# Patient Record
Sex: Male | Born: 1990 | Race: Black or African American | Hispanic: No | Marital: Single | Smoking: Current some day smoker
Health system: Southern US, Community
[De-identification: ages and names within clinical notes are randomized; demographics above are authoritative.]

## PROBLEM LIST (undated history)

## (undated) DIAGNOSIS — B20 Human immunodeficiency virus [HIV] disease: Secondary | ICD-10-CM

## (undated) DIAGNOSIS — Z21 Asymptomatic human immunodeficiency virus [HIV] infection status: Secondary | ICD-10-CM

## (undated) HISTORY — DX: Asymptomatic human immunodeficiency virus (hiv) infection status: Z21

## (undated) HISTORY — DX: Human immunodeficiency virus (HIV) disease: B20

---
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
## 2009-04-07 ENCOUNTER — Encounter: Payer: Self-pay | Admitting: Internal Medicine

## 2009-04-07 ENCOUNTER — Ambulatory Visit: Payer: Self-pay | Admitting: Internal Medicine

## 2009-04-07 DIAGNOSIS — J309 Allergic rhinitis, unspecified: Secondary | ICD-10-CM | POA: Insufficient documentation

## 2009-04-07 DIAGNOSIS — L309 Dermatitis, unspecified: Secondary | ICD-10-CM

## 2009-04-07 DIAGNOSIS — B20 Human immunodeficiency virus [HIV] disease: Secondary | ICD-10-CM

## 2009-04-07 DIAGNOSIS — Z21 Asymptomatic human immunodeficiency virus [HIV] infection status: Secondary | ICD-10-CM | POA: Insufficient documentation

## 2009-04-07 LAB — CONVERTED CEMR LAB
ALT: 12 units/L (ref 0–53)
AST: 18 units/L (ref 0–37)
Albumin: 4.4 g/dL (ref 3.5–5.2)
Alkaline Phosphatase: 74 units/L (ref 39–117)
BUN: 9 mg/dL (ref 6–23)
CO2: 26 meq/L (ref 19–32)
Calcium: 9.5 mg/dL (ref 8.4–10.5)
Chloride: 105 meq/L (ref 96–112)
Creatinine, Ser: 1.05 mg/dL (ref 0.40–1.50)
Glucose, Bld: 64 mg/dL — ABNORMAL LOW (ref 70–99)
HIV 1 RNA Quant: 12500 copies/mL — ABNORMAL HIGH (ref <48)
HIV-1 RNA Quant, Log: 4.1 — ABNORMAL HIGH (ref <1.68)
Potassium: 4.2 meq/L (ref 3.5–5.3)
Sodium: 142 meq/L (ref 135–145)
Total Bilirubin: 0.5 mg/dL (ref 0.3–1.2)
Total Protein: 7.6 g/dL (ref 6.0–8.3)

## 2009-08-05 ENCOUNTER — Telehealth: Payer: Self-pay | Admitting: Internal Medicine

## 2009-09-01 ENCOUNTER — Encounter: Payer: Self-pay | Admitting: Internal Medicine

## 2009-09-01 ENCOUNTER — Ambulatory Visit: Payer: Self-pay | Admitting: Internal Medicine

## 2009-09-01 DIAGNOSIS — B3781 Candidal esophagitis: Secondary | ICD-10-CM | POA: Insufficient documentation

## 2009-09-01 LAB — CONVERTED CEMR LAB
ALT: 8 units/L (ref 0–53)
AST: 14 units/L (ref 0–37)
Absolute CD4: 26 #/uL — ABNORMAL LOW (ref 530–1300)
Albumin: 4.4 g/dL (ref 3.5–5.2)
Alkaline Phosphatase: 71 units/L (ref 39–117)
BUN: 8 mg/dL (ref 6–23)
Basophils Absolute: 0 10*3/uL (ref 0.0–0.1)
Basophils Relative: 1 % (ref 0–1)
CD4 T Helper %: 3 % — ABNORMAL LOW (ref 31–52)
CO2: 28 meq/L (ref 19–32)
Calcium: 8.9 mg/dL (ref 8.4–10.5)
Chloride: 103 meq/L (ref 96–112)
Creatinine, Ser: 1.27 mg/dL (ref 0.40–1.50)
Eosinophils Absolute: 0.4 10*3/uL (ref 0.0–0.7)
Eosinophils Relative: 9 % — ABNORMAL HIGH (ref 0–5)
Glucose, Bld: 97 mg/dL (ref 70–99)
HCT: 41.1 % (ref 39.0–52.0)
HIV 1 RNA Quant: 38000 copies/mL — ABNORMAL HIGH (ref <48)
HIV-1 RNA Quant, Log: 4.58 — ABNORMAL HIGH (ref <1.68)
Hemoglobin: 13.3 g/dL (ref 13.0–17.0)
Lymphocytes Relative: 21 % (ref 12–46)
Lymphs Abs: 0.9 10*3/uL (ref 0.7–4.0)
MCHC: 32.4 g/dL (ref 30.0–36.0)
MCV: 89.3 fL (ref 78.0–100.0)
Monocytes Absolute: 0.5 10*3/uL (ref 0.1–1.0)
Monocytes Relative: 12 % (ref 3–12)
Neutro Abs: 2.4 10*3/uL (ref 1.7–7.7)
Neutrophils Relative %: 58 % (ref 43–77)
Platelets: 242 10*3/uL (ref 150–400)
Potassium: 3.8 meq/L (ref 3.5–5.3)
RBC: 4.6 M/uL (ref 4.22–5.81)
RDW: 13.6 % (ref 11.5–15.5)
Sodium: 140 meq/L (ref 135–145)
Total Bilirubin: 1 mg/dL (ref 0.3–1.2)
Total Protein: 7.6 g/dL (ref 6.0–8.3)
Total lymphocyte count: 882 cells/mcL — ABNORMAL LOW (ref 1100–4800)
WBC: 4.2 10*3/uL (ref 4.0–10.5)

## 2009-09-04 ENCOUNTER — Encounter: Payer: Self-pay | Admitting: Internal Medicine

## 2009-09-04 LAB — CONVERTED CEMR LAB

## 2009-12-08 ENCOUNTER — Emergency Department (HOSPITAL_COMMUNITY): Admission: EM | Admit: 2009-12-08 | Discharge: 2009-12-08 | Payer: Self-pay | Admitting: Emergency Medicine

## 2010-01-30 ENCOUNTER — Telehealth: Payer: Self-pay | Admitting: Internal Medicine

## 2010-05-04 ENCOUNTER — Emergency Department (HOSPITAL_COMMUNITY): Admission: EM | Admit: 2010-05-04 | Discharge: 2010-05-04 | Payer: Self-pay | Admitting: Emergency Medicine

## 2010-05-14 ENCOUNTER — Emergency Department (HOSPITAL_COMMUNITY): Admission: EM | Admit: 2010-05-14 | Discharge: 2010-05-14 | Payer: Self-pay | Admitting: Family Medicine

## 2010-05-18 ENCOUNTER — Encounter: Payer: Self-pay | Admitting: Internal Medicine

## 2010-05-27 ENCOUNTER — Ambulatory Visit: Payer: Self-pay | Admitting: Internal Medicine

## 2010-05-27 LAB — CONVERTED CEMR LAB
ALT: <8 U/L
AST: 17 U/L
Albumin: 3.5 g/dL
Alkaline Phosphatase: 39 U/L
BUN: 11 mg/dL
Basophils Absolute: 0 10*3/uL
Basophils Relative: 0 %
CO2: 23 meq/L
Calcium: 8.6 mg/dL
Chloride: 100 meq/L
Creatinine, Ser: 1.44 mg/dL
Eosinophils Absolute: 0 10*3/uL
Eosinophils Relative: 1 %
Glucose, Bld: 96 mg/dL
HCT: 36.3 % — ABNORMAL LOW
HIV 1 RNA Quant: 51700 {copies}/mL — ABNORMAL HIGH
HIV-1 RNA Quant, Log: 4.71 — ABNORMAL HIGH
Hemoglobin: 11.9 g/dL — ABNORMAL LOW
Lymphocytes Relative: 19 %
Lymphs Abs: 0.7 10*3/uL
MCHC: 32.8 g/dL
MCV: 87.7 fL
Monocytes Absolute: 0.3 10*3/uL
Monocytes Relative: 8 %
Neutro Abs: 2.7 10*3/uL
Neutrophils Relative %: 71 %
Platelets: 221 10*3/uL
Potassium: 4 meq/L
RBC: 4.14 M/uL — ABNORMAL LOW
RDW: 12.7 %
Sodium: 133 meq/L — ABNORMAL LOW
Total Bilirubin: 0.5 mg/dL
Total Protein: 7.8 g/dL
WBC: 3.8 10*3/uL — ABNORMAL LOW

## 2010-06-23 ENCOUNTER — Ambulatory Visit: Payer: Self-pay | Admitting: Internal Medicine

## 2010-06-23 DIAGNOSIS — B370 Candidal stomatitis: Secondary | ICD-10-CM | POA: Insufficient documentation

## 2010-06-24 ENCOUNTER — Encounter (INDEPENDENT_AMBULATORY_CARE_PROVIDER_SITE_OTHER): Payer: Self-pay | Admitting: *Deleted

## 2010-07-09 ENCOUNTER — Encounter (INDEPENDENT_AMBULATORY_CARE_PROVIDER_SITE_OTHER): Payer: Self-pay | Admitting: *Deleted

## 2010-07-15 ENCOUNTER — Ambulatory Visit: Payer: Self-pay | Admitting: Internal Medicine

## 2010-07-15 ENCOUNTER — Encounter (INDEPENDENT_AMBULATORY_CARE_PROVIDER_SITE_OTHER): Payer: Self-pay | Admitting: *Deleted

## 2010-08-05 ENCOUNTER — Encounter: Payer: Self-pay | Admitting: Internal Medicine

## 2010-08-05 ENCOUNTER — Encounter (INDEPENDENT_AMBULATORY_CARE_PROVIDER_SITE_OTHER): Payer: Self-pay | Admitting: *Deleted

## 2010-08-31 ENCOUNTER — Ambulatory Visit: Payer: Self-pay | Admitting: Internal Medicine

## 2010-08-31 ENCOUNTER — Encounter (INDEPENDENT_AMBULATORY_CARE_PROVIDER_SITE_OTHER): Payer: Self-pay | Admitting: *Deleted

## 2010-08-31 LAB — CONVERTED CEMR LAB
ALT: 22 units/L (ref 0–53)
AST: 24 units/L (ref 0–37)
Albumin: 3.8 g/dL (ref 3.5–5.2)
Alkaline Phosphatase: 54 units/L (ref 39–117)
BUN: 5 mg/dL — ABNORMAL LOW (ref 6–23)
Basophils Absolute: 0 10*3/uL (ref 0.0–0.1)
Basophils Relative: 1 % (ref 0–1)
CO2: 25 meq/L (ref 19–32)
Calcium: 9.4 mg/dL (ref 8.4–10.5)
Chloride: 103 meq/L (ref 96–112)
Creatinine, Ser: 1.05 mg/dL (ref 0.40–1.50)
Eosinophils Absolute: 0.3 10*3/uL (ref 0.0–0.7)
Eosinophils Relative: 10 % — ABNORMAL HIGH (ref 0–5)
Glucose, Bld: 88 mg/dL (ref 70–99)
HCT: 39.7 % (ref 39.0–52.0)
HIV 1 RNA Quant: 39 copies/mL — ABNORMAL HIGH (ref <20)
HIV-1 RNA Quant, Log: 1.59 — ABNORMAL HIGH (ref <1.30)
Hemoglobin: 12.9 g/dL — ABNORMAL LOW (ref 13.0–17.0)
Lymphocytes Relative: 37 % (ref 12–46)
Lymphs Abs: 1.3 10*3/uL (ref 0.7–4.0)
MCHC: 32.5 g/dL (ref 30.0–36.0)
MCV: 93.6 fL (ref 78.0–100.0)
Monocytes Absolute: 0.5 10*3/uL (ref 0.1–1.0)
Monocytes Relative: 14 % — ABNORMAL HIGH (ref 3–12)
Neutro Abs: 1.4 10*3/uL — ABNORMAL LOW (ref 1.7–7.7)
Neutrophils Relative %: 38 % — ABNORMAL LOW (ref 43–77)
Platelets: 266 10*3/uL (ref 150–400)
Potassium: 3.7 meq/L (ref 3.5–5.3)
RBC: 4.24 M/uL (ref 4.22–5.81)
RDW: 15.9 % — ABNORMAL HIGH (ref 11.5–15.5)
Sodium: 139 meq/L (ref 135–145)
Total Bilirubin: 0.4 mg/dL (ref 0.3–1.2)
Total Protein: 6.9 g/dL (ref 6.0–8.3)
WBC: 3.5 10*3/uL — ABNORMAL LOW (ref 4.0–10.5)

## 2010-09-09 ENCOUNTER — Encounter (INDEPENDENT_AMBULATORY_CARE_PROVIDER_SITE_OTHER): Payer: Self-pay | Admitting: *Deleted

## 2010-09-11 ENCOUNTER — Encounter: Payer: Self-pay | Admitting: Internal Medicine

## 2010-09-14 ENCOUNTER — Telehealth (INDEPENDENT_AMBULATORY_CARE_PROVIDER_SITE_OTHER): Payer: Self-pay | Admitting: *Deleted

## 2010-09-14 ENCOUNTER — Ambulatory Visit: Payer: Self-pay | Admitting: Internal Medicine

## 2010-09-14 DIAGNOSIS — A630 Anogenital (venereal) warts: Secondary | ICD-10-CM | POA: Insufficient documentation

## 2010-09-16 ENCOUNTER — Encounter (INDEPENDENT_AMBULATORY_CARE_PROVIDER_SITE_OTHER): Payer: Self-pay | Admitting: *Deleted

## 2010-09-18 ENCOUNTER — Encounter: Payer: Self-pay | Admitting: Internal Medicine

## 2010-09-21 ENCOUNTER — Encounter: Payer: Self-pay | Admitting: Internal Medicine

## 2010-10-05 ENCOUNTER — Encounter: Payer: Self-pay | Admitting: Infectious Diseases

## 2010-10-27 ENCOUNTER — Encounter: Payer: Self-pay | Admitting: Internal Medicine

## 2010-11-13 ENCOUNTER — Encounter: Payer: Self-pay | Admitting: Internal Medicine

## 2010-12-15 ENCOUNTER — Ambulatory Visit
Admission: RE | Admit: 2010-12-15 | Discharge: 2010-12-15 | Payer: Self-pay | Source: Home / Self Care | Attending: Internal Medicine | Admitting: Internal Medicine

## 2010-12-15 ENCOUNTER — Encounter: Payer: Self-pay | Admitting: Internal Medicine

## 2010-12-15 LAB — CONVERTED CEMR LAB
ALT: 9 units/L (ref 0–53)
AST: 15 units/L (ref 0–37)
Albumin: 4.3 g/dL (ref 3.5–5.2)
Alkaline Phosphatase: 71 units/L (ref 39–117)
BUN: 10 mg/dL (ref 6–23)
Basophils Absolute: 0 10*3/uL (ref 0.0–0.1)
Basophils Relative: 1 % (ref 0–1)
CO2: 27 meq/L (ref 19–32)
Calcium: 9.6 mg/dL (ref 8.4–10.5)
Chloride: 105 meq/L (ref 96–112)
Creatinine, Ser: 1.16 mg/dL (ref 0.40–1.50)
Eosinophils Absolute: 0.7 10*3/uL (ref 0.0–0.7)
Eosinophils Relative: 13 % — ABNORMAL HIGH (ref 0–5)
Glucose, Bld: 81 mg/dL (ref 70–99)
HCT: 40.3 % (ref 39.0–52.0)
HIV 1 RNA Quant: <20 copies/mL (ref <20)
HIV-1 RNA Quant, Log: <1.3 (ref <1.30)
Hemoglobin: 13.6 g/dL (ref 13.0–17.0)
Lymphocytes Relative: 29 % (ref 12–46)
Lymphs Abs: 1.4 10*3/uL (ref 0.7–4.0)
MCHC: 33.7 g/dL (ref 30.0–36.0)
MCV: 93.3 fL (ref 78.0–100.0)
Monocytes Absolute: 0.4 10*3/uL (ref 0.1–1.0)
Monocytes Relative: 9 % (ref 3–12)
Neutro Abs: 2.4 10*3/uL (ref 1.7–7.7)
Neutrophils Relative %: 48 % (ref 43–77)
Platelets: 250 10*3/uL (ref 150–400)
Potassium: 4.6 meq/L (ref 3.5–5.3)
RBC: 4.32 M/uL (ref 4.22–5.81)
RDW: 13 % (ref 11.5–15.5)
Sodium: 139 meq/L (ref 135–145)
Total Bilirubin: 2.8 mg/dL — ABNORMAL HIGH (ref 0.3–1.2)
Total Protein: 7.2 g/dL (ref 6.0–8.3)
WBC: 4.9 10*3/uL (ref 4.0–10.5)

## 2010-12-28 ENCOUNTER — Ambulatory Visit: Admit: 2010-12-28 | Payer: Self-pay | Admitting: Internal Medicine

## 2011-01-12 ENCOUNTER — Ambulatory Visit: Admit: 2011-01-12 | Payer: Self-pay | Admitting: Adult Health

## 2011-01-12 NOTE — Miscellaneous (Signed)
Summary: [place]  SS Administration  [place]  SS Administration   Imported By: Florinda Marker 10/27/2010 16:15:58  _____________________________________________________________________  External Attachment:    Type:   Image     Comment:   External Document

## 2011-01-12 NOTE — Miscellaneous (Signed)
Summary: Bridge Counselor  Clinical Lists Changes BC enrolled pt into the bridge counseling program this date. BC provided HIV education and safe sex practices. BC reviewed the control measures with pt and gave pt condoms. BC also assisted, with the help of [facility], pt with accessing his medications. Pt has all medications that were prescribed. BC gave pt a pillbox and provided Tx adherence while watching pt fill up his pillbox. BC discussed wth pt each medication, how and when to take them and what each was for. BC will assist pt with applying for medicaid and disability.  Sharol Roussel  June 25, 2010 11:12 AM

## 2011-01-12 NOTE — Consult Note (Signed)
Summary: [facility]: Dr. Alferd Patee [facility]: Dr. Pershing Proud   Imported By: Florinda Marker 09/03/2010 16:33:31  _____________________________________________________________________  External Attachment:    Type:   Image     Comment:   External Document

## 2011-01-12 NOTE — Progress Notes (Signed)
Summary: [facility]  Phone Note Call from Patient   Caller: Patient Summary of Call: RX for Fluconazole and Aldara cream called to [facility]  Initial call taken by: Wendall Mola CMA Duncan Dull),  September 14, 2010 12:22 PM

## 2011-01-12 NOTE — Miscellaneous (Signed)
Summary: HIPAA Restrictions  HIPAA Restrictions   Imported By: Florinda Marker 05/29/2010 09:58:54  _____________________________________________________________________  External Attachment:    Type:   Image     Comment:   External Document

## 2011-01-12 NOTE — Consult Note (Signed)
Summary: [facility] Healthcare:Dermatology  [facility] Healthcare:Dermatology   Imported By: Florinda Marker 09/17/2010 10:46:07  _____________________________________________________________________  External Attachment:    Type:   Image     Comment:   External Document

## 2011-01-12 NOTE — Miscellaneous (Signed)
Summary: Bridge Counselor  Clinical Lists Changes BC closed pt's file for bridge counseling today. Pt is enrolled at [facility] for medical case management.  Sharol Roussel  September 16, 2010 12:05 PM

## 2011-01-12 NOTE — Miscellaneous (Signed)
Summary: Bridge Counselor  Clinical Lists Changes BC transported pt to [facility] today for lab work. Pt also brought his new medicaid card with him as he has been approved. Pt will now access his medications through [facility]. BC made ct's referral to [facility] and let Paulo Fruit know because pt was receiving his medications through ADAP. BC will assist pt with the transition prior to referring him to [facility] for ongoing case management.  Sharol Roussel  August 31, 2010 2:03 PM

## 2011-01-12 NOTE — Miscellaneous (Signed)
Summary: RW Update  Clinical Lists Changes  Observations: Added new observation of PAYOR: Medicaid (09/18/2010 11:06)

## 2011-01-12 NOTE — Miscellaneous (Signed)
Summary: Bridge Counselor  Clinical Lists Changes BC transported pt to DSS today to apply for medicaid. Pt was told that since he is under 21 and had no paperwork to turn in that his application would be processed pretty quickly. BC also called [facility] to schedule his dermotology appointment. Pt will be seen on August 24th at 10am unless his medicaid is approved before then.   Sharol Roussel  July 09, 2010 5:24 PM

## 2011-01-12 NOTE — Assessment & Plan Note (Signed)
Summary: START MEDS/VS   CC:  pt. here to be started on HIV meds and refill Azithromycin.  History of Present Illness: Pt was aapproved for ADAP and is ready to start HIV meds. He has gained 10lbs and feels better. No new problems.  Preventive Screening-Counseling & Management  Alcohol-Tobacco     Alcohol drinks/day: 0     Smoking Status: current     Packs/Day: <0.25  Caffeine-Diet-Exercise     Caffeine use/day: tea and sodas     Does Patient Exercise: yes     Type of exercise: walking     Exercise (avg: min/session): <30     Times/week: 3  Safety-Violence-Falls     Seat Belt Use: yes      Sexual History:  dating.    Comments: pt. given condoms   Updated Prior Medication List: BACTRIM DS 800-160 MG TABS (SULFAMETHOXAZOLE-TRIMETHOPRIM) Take 1 tablet by mouth once a day CLARITIN 10 MG TABS (LORATADINE) Take 1 tablet by mouth once a day TRIAMCINOLONE ACETONIDE 0.1 % CREA (TRIAMCINOLONE ACETONIDE) apply two times a day AZITHROMYCIN 600 MG TABS (AZITHROMYCIN) take 2 tablets once a week TRUVADA 200-300 MG TABS (EMTRICITABINE-TENOFOVIR) Take 1 tablet by mouth once a day REYATAZ 300 MG CAPS (ATAZANAVIR SULFATE) Take 1 capsule by mouth once a day NORVIR 100 MG TABS (RITONAVIR) Take 1 tablet by mouth once a day  Current Allergies (reviewed today): No known allergies  Past History:  Past Medical History: Last updated: 04/07/2009 HIV disease Allergic rhinitis  Social History: Sexual History:  dating  Review of Systems  The patient denies anorexia, fever, headaches, and abdominal pain.    Vital Signs:  Patient profile:   20 year old male Height:      67 inches (170.18 cm) Weight:      148.8 pounds (67.64 kg) BMI:     23.39 Temp:     98.1 degrees F (36.72 degrees C) oral Pulse rate:   80 / minute BP sitting:   117 / 75  (right arm)  Vitals Entered By: Wendall Mola CMA Duncan Dull) (July 15, 2010 3:19 PM) CC: pt. here to be started on HIV meds, refill  Azithromycin Is Patient Diabetic? No Pain Assessment Patient in pain? no      Nutritional Status BMI of 19 -24 = normal Nutritional Status Detail appetite "great"  Have you ever been in a relationship where you felt threatened, hurt or afraid?No   Does patient need assistance? Functional Status Self care Ambulation Normal Comments pt. missed three doses of meds since last visit   Physical Exam  General:  alert, well-developed, well-nourished, and well-hydrated.   Head:  normocephalic and atraumatic.   Mouth:  no thrush  Lungs:  normal breath sounds.      Impression & Recommendations:  Problem # 1:  HIV DISEASE (ICD-042) Genotype shows a 103N mutation. Discussed treatment options.  Pt would like a once a day regimen.  Will start Reyataz, Norvir and Truvada.  Potential side effects discussed.  Emphasized the need to take his meds every day. He willl return in 6 weeks for repeat labs. 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 Take 1  tablet by mouth once a day 2)  Reyataz 300 Mg Caps (Atazanavir sulfate) .... Take 1 capsule by mouth once a day 3)  Norvir 100 Mg Tabs (Ritonavir) .... Take 1 tablet by mouth once a day  Other Orders: Est. Patient Level IV (16109) Future Orders: T-CD4SP ([facility]) (CD4SP) ... 08/26/2010 T-HIV Viral Load [id number]) ... 08/26/2010 T-Comprehensive Metabolic Panel [id number]) ... 08/26/2010 T-CBC w/Diff ([id number]) ... 08/26/2010  Patient Instructions: 1)  Please schedule a follow-up appointment in 8 weeks, 2 weeks after labs.

## 2011-01-12 NOTE — Miscellaneous (Signed)
Summary: clinical update/ryan white  Clinical Lists Changes  Observations: Added new observation of RWTITLE: B (07/15/2010 9:04) Added new observation of PAYOR: No Insurance (07/15/2010 9:04) Added new observation of AIDSDAP: Yes 2011 (07/15/2010 9:04) Added new observation of INCOMESOURCE: none (07/15/2010 9:04) Added new observation of HOUSEINCOME: 0  (07/15/2010 9:04) Added new observation of #CHILD<18 IN: No  (07/15/2010 9:04) Added new observation of FAMILYSIZE: 1  (07/15/2010 9:04) Added new observation of HOUSING: Stable/permanent  (07/15/2010 9:04) Added new observation of FINASSESSDT: 06/24/2010  (07/15/2010 9:04) Added new observation of YEARLYEXPEN: 0  (07/15/2010 9:04) Added new observation of GENDER: Male  (07/15/2010 9:04) Added new observation of MARITAL STAT: Single  (07/15/2010 9:04) Added new observation of LATINO/HISP: No  (07/15/2010 9:04) Added new observation of RACE: African American  (07/15/2010 9:04) Added new observation of REC_MESSAGE: Yes  (07/15/2010 9:04) Added new observation of RECPHONECALL: Yes  (07/15/2010 9:04) Added new observation of REC_MAIL: Yes  (07/15/2010 9:04) Added new observation of RW VITAL STA: Active  (07/15/2010 9:04) Added new observation of PATNTCOUNTY: [place]  (07/15/2010 9:04) Added new observation of RWPARTICIP: Yes  (07/15/2010 9:04)

## 2011-01-12 NOTE — Miscellaneous (Signed)
Summary: Bridge Counselor  Clinical Lists Changes BC transported pt to [facility] today for his follow up appointment with dermatology. Pt also reports that [facility] has been out and has completed his enrollment. Pt will see Dr. Philipp Deputy on October 3rd and then has an intake appointment with [facility] for medical case management. Pt has been compliant with all medications and is doing very well. BC will close pt's file.  Sharol Roussel  September 09, 2010 2:30 PM

## 2011-01-12 NOTE — Miscellaneous (Signed)
Summary: [facility]  [facility]   Imported By: Florinda Marker 10/05/2010 11:37:39  _____________________________________________________________________  External Attachment:    Type:   Image     Comment:   External Document

## 2011-01-12 NOTE — Assessment & Plan Note (Signed)
Summary: F/U [MKJ]   CC:  follow-up visit, lab results, and c/o anal warts requesting Aldara Cream.  History of Present Illness: Pt doing well on his meds.  No side effects.  Feels better. He has some anal warts and has been referred toa specilaist. He requests some aldara.  Preventive Screening-Counseling & Management  Alcohol-Tobacco     Alcohol drinks/day: 0     Smoking Status: current     Packs/Day: <0.25  Caffeine-Diet-Exercise     Caffeine use/day: tea and sodas     Does Patient Exercise: yes     Type of exercise: walking     Exercise (avg: min/session): <30     Times/week: 3  Safety-Violence-Falls     Seat Belt Use: yes      Sexual History:  dating.        Drug Use:  current and occasional maijuana.    Comments: pt. given condoms   Updated Prior Medication List: BACTRIM DS 800-160 MG TABS (SULFAMETHOXAZOLE-TRIMETHOPRIM) Take 1 tablet by mouth once a day CLARITIN 10 MG TABS (LORATADINE) Take 1 tablet by mouth once a day TRIAMCINOLONE ACETONIDE 0.1 % CREA (TRIAMCINOLONE ACETONIDE) apply two times a day AZITHROMYCIN 600 MG TABS (AZITHROMYCIN) take 2 tablets once a week TRUVADA 200-300 MG TABS (EMTRICITABINE-TENOFOVIR) Take 1 tablet by mouth once a day REYATAZ 300 MG CAPS (ATAZANAVIR SULFATE) Take 1 capsule by mouth once a day NORVIR 100 MG TABS (RITONAVIR) Take 1 tablet by mouth once a day FLUCONAZOLE 100 MG TABS (FLUCONAZOLE) Take 1 tablet by mouth once a day ALDARA 5 % CREA (IMIQUIMOD) apply 3 times a week and leave on for 8 hours and then wash off  Current Allergies (reviewed today): No known allergies  Past History:  Past Medical History: Last updated: 04/07/2009 HIV disease Allergic rhinitis  Social History: Drug Use:  current, occasional maijuana  Review of Systems  The patient denies anorexia, fever, and weight loss.    Vital Signs:  Patient profile:   20 year old male Height:      67 inches (170.18 cm) Weight:      170.8 pounds (77.64  kg) BMI:     26.85 Temp:     98.2 degrees F (36.78 degrees C) oral Pulse rate:   60 / minute BP sitting:   120 / 66  (left arm)  Vitals Entered By: Wendall Mola CMA Duncan Dull) (September 14, 2010 10:31 AM) CC: follow-up visit, lab results, c/o anal warts requesting Aldara Cream Is Patient Diabetic? No Pain Assessment Patient in pain? no      Nutritional Status BMI of 25 - 29 = overweight Nutritional Status Detail appetite "great"  Does patient need assistance? Functional Status Self care Ambulation Normal Comments no missed doses of meds per pt.   Physical Exam  General:  alert, well-developed, well-nourished, and well-hydrated.   Head:  normocephalic and atraumatic.   Mouth:  pharynx pink and moist.  some thrush present Lungs:  normal breath sounds.      Impression & Recommendations:  Problem # 1:  HIV DISEASE (ICD-042) Pt.s most recent CD4ct was 30 and VL 39 .  Pt instructed to continue the current antiretroviral regimen.  Pt encouraged to take medication regularly and not miss doses.  Pt will f/u in 3 months for repeat blood work and will see me 2 weeks later.  Diagnostics Reviewed:  HIV: CDC-defined AIDS (09/01/2009)   CD4: 30 (09/01/2010)   WBC: 3.5 (08/31/2010)   Hgb: 12.9 (08/31/2010)  HCT: 39.7 (08/31/2010)   Platelets: 266 (08/31/2010) HIV genotype: See Comment (09/04/2009)   HIV-1 RNA: 39 (08/31/2010)   HBSAg: NEG (06/27/2008)  Problem # 2:  THRUSH (ICD-112.0) treat with fluconazole  Problem # 3:  CONDYLOMA ACUMINATUM (ICD-078.11) aldara cream  Medications Added to Medication List This Visit: 1)  Fluconazole 100 Mg Tabs (Fluconazole) .... Take 1 tablet by mouth once a day 2)  Aldara 5 % Crea (Imiquimod) .... Apply 3 times a week and leave on for 8 hours and then wash off  Other Orders: Influenza Vaccine NON MCR (40347) Est. Patient Level III (42595) Future Orders: T-CD4SP ([facility]) (CD4SP) ... 12/13/2010 T-HIV Viral Load [id number]) ...  12/13/2010 T-Comprehensive Metabolic Panel [id number]) ... 12/13/2010 T-CBC w/Diff ([id number]) ... 12/13/2010 T-RPR (Syphilis) [phone]) ... 12/13/2010  Patient Instructions: 1)  Please schedule a follow-up appointment in 3 months, 2 weeks after labs.  Prescriptions: ALDARA 5 % CREA (IMIQUIMOD) apply 3 times a week and leave on for 8 hours and then wash off  #36 packets x 0   Entered and Authorized by:   Yisroel Ramming MD   Signed by:   Yisroel Ramming MD on 09/14/2010   Method used:   Print then Give to Patient   RxID:   [id number] FLUCONAZOLE 100 MG TABS (FLUCONAZOLE) Take 1 tablet by mouth once a day  #10 x 0   Entered and Authorized by:   Yisroel Ramming MD   Signed by:   Yisroel Ramming MD on 09/14/2010   Method used:   Print then Give to Patient   RxID:   [id number]    Immunizations Administered:  Influenza Vaccine # 1:    Vaccine Type: Fluvax Non-MCR    Site: left deltoid    Mfr: Novartis    Dose: 0.5 ml    Route: IM    Given by: Wendall Mola CMA ( AAMA)    Exp. Date: 03/14/2011    Lot #: [id number]    VIS given: 07/07/10 version given September 14, 2010.  Flu Vaccine Consent Questions:    Do you have a history of severe allergic reactions to this vaccine? no    Any prior history of allergic reactions to egg and/or gelatin? no    Do you have a sensitivity to the preservative Thimersol? no    Do you have a past history of Guillan-Barre Syndrome? no    Do you currently have an acute febrile illness? no    Have you ever had a severe reaction to latex? no    Vaccine information given and explained to patient? yes

## 2011-01-12 NOTE — Miscellaneous (Signed)
Summary: [facility]   Imported By: Florinda Marker 11/13/2010 14:49:15  _____________________________________________________________________  External Attachment:    Type:   Image     Comment:   External Document

## 2011-01-12 NOTE — Progress Notes (Signed)
Summary: Fluconazole refill  Phone Note Refill Request Message from:  Pharmacy on January 30, 2010 9:33 AM  Fluconazole 100 mg refill requested from [facility] [phone]. Pt. last seen 09/01/09. TC to pt. #[phone], no asnwer and no VM. 02/02/10 ML with pharmacy pt. needs an MD appt. Sharen Heck RN         Method Requested: Telephone to Pharmacy Next Appointment Scheduled: none Initial call taken by: Sharen Heck RN,  January 30, 2010 9:36 AM  Follow-up for Phone Call        needs appt Follow-up by: Yisroel Ramming MD,  January 30, 2010 10:51 AM

## 2011-01-12 NOTE — Miscellaneous (Signed)
Summary: Lab orders  Clinical Lists Changes  Orders: Added new Test order of T-CBC w/Diff [phone]) - Signed Added new Test order of T-CD4SP [facility]) (CD4SP) - Signed Added new Test order of T-HIV Viral Load [phone]) - Signed Added new Test order of T-Comprehensive Metabolic Panel ([id number]) - Signed

## 2011-01-12 NOTE — Miscellaneous (Signed)
Summary: Bridge Counselor  Clinical Lists Changes BC transported pt to [facility] today for his dermatology appointment. Pt has severely dry skin and ezcema. Pt was given three prescriptions for the scars and itching. Pt was not able to get these filled today due to the cost. Pt should receive his medicaid within the next two weeks and BC will coordinate obtaining his medications once this happens. Pt has been taking his HIV medications as directed. BC provided more Tx adherence today as well as condoms. Pt states that he wants to live a long life and plans to remain compliant.  [facility]  August 05, 2010 2:00 PM

## 2011-01-12 NOTE — Miscellaneous (Signed)
  Clinical Lists Changes  Orders: Added new Referral order of Dermatology Referral (Derma) - Signed 

## 2011-01-12 NOTE — Miscellaneous (Signed)
Summary: Bridge Counselor  Clinical Lists Changes BC transported pt to [facility] today for his scheduled appointment with Dr. Philipp Deputy. Pt was given medications today and they will be shipped to his home next Tuesday. BC discussed Tx adherence with pt today as well. Pt also has applied for his disability.  Sharol Roussel  July 15, 2010 5:01 PM

## 2011-01-12 NOTE — Assessment & Plan Note (Signed)
Summary: new 042/rescheduled from 6/29/kam   CC:  New patient.  History of Present Illness: Pt here to get re-established.  He has not been in care for his HIV for several years due to transportation issues, moving frequently and lack of support. None of his immediate family members know that he is HIV positive.  he is currently staying with his grandmother here in [place].  He c/o night sweats, thrush and pruritic rash mostly on stomach and back.  Preventive Screening-Counseling & Management  Alcohol-Tobacco     Alcohol drinks/day: 0     Smoking Status: current     Packs/Day: <0.25  Caffeine-Diet-Exercise     Caffeine use/day: tea and sodas     Does Patient Exercise: yes     Type of exercise: walks, gym     Exercise (avg: min/session): <30     Times/week: 3  Safety-Violence-Falls     Seat Belt Use: yes   Updated Prior Medication List: BACTRIM DS 800-160 MG TABS (SULFAMETHOXAZOLE-TRIMETHOPRIM) Take 1 tablet by mouth once a day CLARITIN 10 MG TABS (LORATADINE) Take 1 tablet by mouth once a day TRIAMCINOLONE ACETONIDE 0.1 % CREA (TRIAMCINOLONE ACETONIDE) apply two times a day AZITHROMYCIN 600 MG TABS (AZITHROMYCIN) take 2 tablets once a week FLUCONAZOLE 100 MG TABS (FLUCONAZOLE) Take 1 tablet by mouth once a day  Current Allergies (reviewed today): No known allergies  Past History:  Past Medical History: Last updated: 04/07/2009 HIV disease Allergic rhinitis  Review of Systems  The patient denies anorexia, weight loss, prolonged cough, and headaches.    Vital Signs:  Patient profile:   20 year old male Height:      67 inches (170.18 cm) Weight:      139.0 pounds (63.18 kg) BMI:     21.85 Temp:     97.7 degrees F (36.50 degrees C) oral Pulse rate:   103 / minute BP sitting:   106 / 73  (left arm)  Vitals Entered By: Baxter Hire) (June 23, 2010 1:55 PM) CC: New patient Pain Assessment Patient in pain? yes     Location: abdomen Intensity: 5 Type:  burning Onset of pain  pain comes and goes Nutritional Status Detail appetite is so-so per patient  Have you ever been in a relationship where you felt threatened, hurt or afraid?No   Does patient need assistance? Functional Status Self care Ambulation Normal Comments patient has not taken any medications.   Physical Exam  General:  alert, well-developed, well-nourished, and well-hydrated.   Head:  normocephalic and atraumatic.   Mouth:  pharynx pink and moist.  thrush present Lungs:  normal breath sounds.   Heart:  normal rate and regular rhythm.   Skin:  excoriated hyperpigmented patchy rash mostly on stomach and back    Impression & Recommendations:  Problem # 1:  HIV DISEASE (ICD-042) Will refer to bridge counseling to help him obtain his prophylactic medications and apply for Medicaid/ADAP so we can get him started on HIV meds. Will start Bactrim and azithromycin. Pt's genotype shows resistance to Sustiva so Atripla will not be an option.  Once he has a means to obtain his medications I will have him return to discuss treatment options. Disccused safe sex. Orders: New Patient Level III [phone]) Dermatology Referral (Derma) Misc. Referral (Misc. Ref)  Diagnostics Reviewed:  HIV: CDC-defined AIDS (09/01/2009)   CD4: 10 (05/28/2010)   WBC: 3.8 (05/27/2010)   Hgb: 11.9 (05/27/2010)   HCT: 36.3 (05/27/2010)   Platelets: 221 (05/27/2010) HIV  genotype: See Comment (09/04/2009)   HIV-1 RNA: 51700 (05/27/2010)   HBSAg: NEG (06/27/2008)  Problem # 2:  ECZEMA (ICD-692.9) refer to Dermatology The following medications were removed from the medication list:    Betamethasone Dipropionate Aug 0.05 % Crea (Aug betamethasone dipropionate) .Marland Kitchen... Apply two times a day His updated medication list for this problem includes:    Claritin 10 Mg Tabs (Loratadine) .Marland Kitchen... Take 1 tablet by mouth once a day    Triamcinolone Acetonide 0.1 % Crea (Triamcinolone acetonide) .Marland Kitchen... Apply two times a  day  Orders: Dermatology Referral (Derma)  Problem # 3:  THRUSH (ICD-112.0) treat with fluconazole  Medications Added to Medication List This Visit: 1)  Triamcinolone Acetonide 0.1 % Crea (Triamcinolone acetonide) .... Apply two times a day 2)  Azithromycin 600 Mg Tabs (Azithromycin) .... Take 2 tablets once a week 3)  Fluconazole 100 Mg Tabs (Fluconazole) .... Take 1 tablet by mouth once a day Prescriptions: FLUCONAZOLE 100 MG TABS (FLUCONAZOLE) Take 1 tablet by mouth once a day  #10 x 0   Entered and Authorized by:   Yisroel Ramming MD   Signed by:   Yisroel Ramming MD on 06/23/2010   Method used:   Print then Give to Patient   RxID:   [id number] AZITHROMYCIN 600 MG TABS (AZITHROMYCIN) take 2 tablets once a week  #10 x 5   Entered and Authorized by:   Yisroel Ramming MD   Signed by:   Yisroel Ramming MD on 06/23/2010   Method used:   Print then Give to Patient   RxID:   [id number] TRIAMCINOLONE ACETONIDE 0.1 % CREA (TRIAMCINOLONE ACETONIDE) apply two times a day  #60gm x 1   Entered and Authorized by:   Yisroel Ramming MD   Signed by:   Yisroel Ramming MD on 06/23/2010   Method used:   Print then Give to Patient   RxID:   [id number] CLARITIN 10 MG TABS (LORATADINE) Take 1 tablet by mouth once a day  #30 x 5   Entered and Authorized by:   Yisroel Ramming MD   Signed by:   Yisroel Ramming MD on 06/23/2010   Method used:   Print then Give to Patient   RxID:   [id number] BACTRIM DS 800-160 MG TABS (SULFAMETHOXAZOLE-TRIMETHOPRIM) Take 1 tablet by mouth once a day  #30 x 5   Entered and Authorized by:   Yisroel Ramming MD   Signed by:   Yisroel Ramming MD on 06/23/2010   Method used:   Print then Give to Patient   RxID:   [id number]  Sample Given, Lot #: zithromax 600mg  #10 [id number] Expiration Date:06 2012 Patient has been instructed regarding the correct time, dose and frequency of taking this med, including desired effects and most common side effects. Paulo Fruit   BS,CPht II,MPH  June 23, 2010 3:22 PM

## 2011-01-14 NOTE — Miscellaneous (Signed)
Summary: [place] [employer]  [employer] [employer]   Imported By: Florinda Marker 11/23/2010 10:01:07  _____________________________________________________________________  External Attachment:    Type:   Image     Comment:   External Document

## 2011-01-15 ENCOUNTER — Encounter (INDEPENDENT_AMBULATORY_CARE_PROVIDER_SITE_OTHER): Payer: Self-pay | Admitting: *Deleted

## 2011-01-20 NOTE — Miscellaneous (Signed)
Summary: RW update  Clinical Lists Changes  Observations: Added new observation of HIV RISK BEH: MSM (01/15/2011 16:29)

## 2011-01-28 ENCOUNTER — Ambulatory Visit: Payer: Self-pay | Admitting: Adult Health

## 2011-01-29 ENCOUNTER — Ambulatory Visit (INDEPENDENT_AMBULATORY_CARE_PROVIDER_SITE_OTHER): Payer: Medicaid Other | Admitting: Adult Health

## 2011-01-29 ENCOUNTER — Encounter: Payer: Self-pay | Admitting: Adult Health

## 2011-01-29 DIAGNOSIS — L259 Unspecified contact dermatitis, unspecified cause: Secondary | ICD-10-CM

## 2011-01-29 DIAGNOSIS — B20 Human immunodeficiency virus [HIV] disease: Secondary | ICD-10-CM

## 2011-01-29 DIAGNOSIS — A630 Anogenital (venereal) warts: Secondary | ICD-10-CM

## 2011-02-01 ENCOUNTER — Encounter (INDEPENDENT_AMBULATORY_CARE_PROVIDER_SITE_OTHER): Payer: Self-pay | Admitting: *Deleted

## 2011-02-09 NOTE — Miscellaneous (Signed)
  Clinical Lists Changes 

## 2011-02-22 LAB — T-HELPER CELL (CD4) - (RCID CLINIC ONLY)
CD4 % Helper T Cell: 7 % — ABNORMAL LOW (ref 33–55)
CD4 T Cell Abs: 100 uL — ABNORMAL LOW (ref 400–2700)

## 2011-02-25 LAB — T-HELPER CELL (CD4) - (RCID CLINIC ONLY)
CD4 % Helper T Cell: 2 % — ABNORMAL LOW (ref 33–55)
CD4 T Cell Abs: 30 uL — ABNORMAL LOW (ref 400–2700)

## 2011-02-28 LAB — T-HELPER CELL (CD4) - (RCID CLINIC ONLY)
CD4 % Helper T Cell: 1 % — ABNORMAL LOW (ref 33–55)
CD4 T Cell Abs: 10 uL — ABNORMAL LOW (ref 400–2700)

## 2011-03-01 LAB — HIV ANTIBODY (ROUTINE TESTING W REFLEX): HIV: REACTIVE — AB

## 2011-03-02 NOTE — Assessment & Plan Note (Signed)
Summary: new to np reschedule from 1/31 88month f/u [mkj]   Vital Signs:  Patient profile:   20 year old male Height:      67 inches Weight:      172 pounds BMI:     27.04 Temp:     98.2 degrees F oral  Vitals Entered By: Alesia Morin CMA (January 29, 2011 2:59 PM) CC: follow-up visit for labs Is Patient Diabetic? No Pain Assessment Patient in pain? no      Nutritional Status BMI of 25 - 29 = overweight Nutritional Status Detail appetite "good"  Have you ever been in a relationship where you felt threatened, hurt or afraid?No   Does patient need assistance? Functional Status Self care Ambulation Normal Comments no missed doses   CC:  follow-up visit for labs.  History of Present Illness: Adherent with meds with good tolerance.  Does have c/o pruritic operianal warts.  Has not had f/u with a dermatologist since moving here.     Preventive Screening-Counseling & Management  Alcohol-Tobacco     Alcohol drinks/day: 0     Smoking Status: quit < 6 months     Packs/Day: 00  Caffeine-Diet-Exercise     Caffeine use/day: tea and sodas     Does Patient Exercise: yes     Type of exercise: walking     Exercise (avg: min/session): <30     Times/week: 3  Safety-Violence-Falls     Seat Belt Use: yes      Sexual History:  dating.        Drug Use:  current and occasional maijuana.    Comments: pt accepted condoms  Allergies (verified): No Known Drug Allergies  Past History:  Past medical, surgical, family and social histories (including risk factors) reviewed for relevance to current acute and chronic problems.  Past Medical History: Reviewed history from 04/07/2009 and no changes required. HIV disease Allergic rhinitis  Family History: Reviewed history and no changes required.  Social History: Reviewed history and no changes required.  Review of Systems  The patient denies anorexia, fever, weight loss, weight gain, vision loss, decreased hearing,  hoarseness, chest pain, syncope, dyspnea on exertion, peripheral edema, prolonged cough, headaches, hemoptysis, abdominal pain, melena, hematochezia, severe indigestion/heartburn, hematuria, incontinence, genital sores, muscle weakness, suspicious skin lesions, transient blindness, difficulty walking, depression, unusual weight change, abnormal bleeding, enlarged lymph nodes, angioedema, and testicular masses.         Pruritic anal warts, worse after moving bowels.  Physical Exam  General:  alert, well-developed, well-nourished, and well-hydrated.   Head:  normocephalic and atraumatic.   Eyes:  vision grossly intact, pupils equal, pupils round, and pupils reactive to light.   Ears:  R ear normal and L ear normal.   Nose:  no external deformity, no external erythema, and no nasal discharge.   Mouth:  good dentition, no gingival abnormalities, no dental plaque, and pharynx pink and moist.   Neck:  supple and full ROM.   Lungs:  normal respiratory effort and normal breath sounds.   Heart:  normal rate, regular rhythm, no murmur, no gallop, and no rub.   Abdomen:  soft, non-tender, normal bowel sounds, no hepatomegaly, and no splenomegaly.   Rectal:  (+) 2 cm condylomatous lesions note at anal sphincter Genitalia:  Testes bilaterally descended without nodularity, tenderness or masses. No scrotal masses or lesions. No penis lesions or urethral discharge. Msk:  no joint tenderness, no joint swelling, and no joint warmth.   Extremities:  No  clubbing, cyanosis, edema, or deformity noted with normal full range of motion of all joints.   Neurologic:  alert & oriented X3, cranial nerves II-XII intact, strength normal in all extremities, and gait normal.   Skin:  (+) healing hyperpigmented dermopathic patches note to trunk, chest, abd, arms lower back and legs. Cervical Nodes:  Diffuse, shoddyLAD Axillary Nodes:  Diffuse, shoddy LAD Inguinal Nodes:  Diffuse, shoddy LAD Psych:  Oriented X3, memory intact  for recent and remote, normally interactive, good eye contact, not anxious appearing, and not depressed appearing.     Impression & Recommendations:  Problem # 1:  HIV DISEASE (ICD-042)  CD4 100 @ 7% with HIV RNA <20 copies per PCR.  Good immunologic and virologic response to treatment.  Will CPM, have him repeat labs in 8 weeks and f/u in 10 weeks.  Verbally acknowledged and agreed with plan.  His updated medication list for this problem includes:    Bactrim Ds 800-160 Mg Tabs (Sulfamethoxazole-trimethoprim) .Marland Kitchen... Take 1 tablet by mouth once a day    Azithromycin 600 Mg Tabs (Azithromycin) .Marland Kitchen... Take 2 tablets once a week  Orders: Est. Patient Level IV (99214)Future Orders: T-CBC w/Diff ([id number]) ... 03/26/2011 T-CD4SP ([facility]) (CD4SP) ... 03/26/2011 T-Comprehensive Metabolic Panel [id number]) ... 03/26/2011 T-HIV Viral Load ([phone]) ... 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  Agreed that if they are unable to treat or plan of care is not effective he will contact us and we will refer him to [facility] for eval and surgical excision, but he wants to see if dermatologist can help initially.  Orders: Est. Patient Level IV (62952)  Problem # 3:  ECZEMA (ICD-692.9)  Seems to be improving, less pruritic.  Will continue present strategy and monitor response. His updated medication list for this problem includes:    Claritin 10 Mg Tabs (Loratadine) .Marland Kitchen... Take 1 tablet by mouth once a day    Triamcinolone Acetonide 0.1 % Crea (Triamcinolone acetonide) .Marland Kitchen... Apply two times a day  Orders: Est. Patient Level IV (84132)    Orders Added: 1)  T-CBC w/Diff [[id number]] 2)  T-CD4SP ([facility]) [CD4SP] 3)  T-Comprehensive Metabolic Panel [[id number]] 4)  T-HIV Viral Load ([phone] 5)  Est. Patient Level IV [59563]   Immunization History:  PPD  Status History:    PPD Status:  negative (09/01/2009)   Immunization History:  PPD Status History:    PPD Status:  negative (09/01/2009)

## 2011-03-24 ENCOUNTER — Telehealth: Payer: Self-pay | Admitting: Licensed Clinical Social Worker

## 2011-03-24 NOTE — Telephone Encounter (Signed)
Patient called regarding surgical referral.

## 2011-03-30 ENCOUNTER — Other Ambulatory Visit: Payer: Self-pay | Admitting: Infectious Diseases

## 2011-03-30 ENCOUNTER — Other Ambulatory Visit (INDEPENDENT_AMBULATORY_CARE_PROVIDER_SITE_OTHER): Payer: Medicaid Other

## 2011-03-30 DIAGNOSIS — B20 Human immunodeficiency virus [HIV] disease: Secondary | ICD-10-CM

## 2011-03-31 LAB — COMPLETE METABOLIC PANEL WITH GFR
ALT: 9 U/L (ref 0–53)
AST: 16 U/L (ref 0–37)
Albumin: 4.8 g/dL (ref 3.5–5.2)
Alkaline Phosphatase: 88 U/L (ref 39–117)
BUN: 10 mg/dL (ref 6–23)
CO2: 26 mEq/L (ref 19–32)
Calcium: 10 mg/dL (ref 8.4–10.5)
Chloride: 104 mEq/L (ref 96–112)
Creat: 1.11 mg/dL (ref 0.40–1.50)
GFR, Est African American: >60 mL/min (ref >60)
GFR, Est Non African American: >60 mL/min (ref >60)
Glucose, Bld: 87 mg/dL (ref 70–99)
Potassium: 4.2 mEq/L (ref 3.5–5.3)
Sodium: 139 mEq/L (ref 135–145)
Total Bilirubin: 3.2 mg/dL — ABNORMAL HIGH (ref 0.3–1.2)
Total Protein: 7.5 g/dL (ref 6.0–8.3)

## 2011-03-31 LAB — CBC WITH DIFFERENTIAL/PLATELET
Basophils Absolute: 0 10*3/uL (ref 0.0–0.1)
Basophils Relative: 1 % (ref 0–1)
Eosinophils Absolute: 0.3 10*3/uL (ref 0.0–0.7)
Eosinophils Relative: 8 % — ABNORMAL HIGH (ref 0–5)
HCT: 43.1 % (ref 39.0–52.0)
Hemoglobin: 14.2 g/dL (ref 13.0–17.0)
Lymphocytes Relative: 40 % (ref 12–46)
Lymphs Abs: 1.8 10*3/uL (ref 0.7–4.0)
MCH: 30.8 pg (ref 26.0–34.0)
MCHC: 32.9 g/dL (ref 30.0–36.0)
MCV: 93.5 fL (ref 78.0–100.0)
Monocytes Absolute: 0.4 10*3/uL (ref 0.1–1.0)
Monocytes Relative: 8 % (ref 3–12)
Neutro Abs: 1.9 10*3/uL (ref 1.7–7.7)
Neutrophils Relative %: 43 % (ref 43–77)
Platelets: 243 10*3/uL (ref 150–400)
RBC: 4.61 MIL/uL (ref 4.22–5.81)
RDW: 12.5 % (ref 11.5–15.5)
WBC: 4.4 10*3/uL (ref 4.0–10.5)

## 2011-03-31 LAB — T-HELPER CELL (CD4) - (RCID CLINIC ONLY)
CD4 % Helper T Cell: 13 % — ABNORMAL LOW (ref 33–55)
CD4 T Cell Abs: 230 uL — ABNORMAL LOW (ref 400–2700)

## 2011-04-01 LAB — HIV-1 RNA QUANT-NO REFLEX-BLD
HIV 1 RNA Quant: <20 copies/mL (ref <20)
HIV-1 RNA Quant, Log: <1.3 {Log} (ref <1.30)

## 2011-04-13 ENCOUNTER — Ambulatory Visit: Payer: Medicaid Other | Admitting: Adult Health

## 2011-04-27 ENCOUNTER — Ambulatory Visit: Payer: Medicaid Other | Admitting: Adult Health

## 2011-05-04 ENCOUNTER — Ambulatory Visit: Payer: Medicaid Other | Admitting: Adult Health

## 2011-05-06 ENCOUNTER — Ambulatory Visit: Payer: Medicaid Other | Admitting: Adult Health

## 2011-05-12 ENCOUNTER — Ambulatory Visit (INDEPENDENT_AMBULATORY_CARE_PROVIDER_SITE_OTHER): Payer: Medicaid Other | Admitting: Adult Health

## 2011-05-12 ENCOUNTER — Encounter: Payer: Self-pay | Admitting: Adult Health

## 2011-05-12 DIAGNOSIS — B20 Human immunodeficiency virus [HIV] disease: Secondary | ICD-10-CM

## 2011-05-12 DIAGNOSIS — Z79899 Other long term (current) drug therapy: Secondary | ICD-10-CM

## 2011-05-12 DIAGNOSIS — Z21 Asymptomatic human immunodeficiency virus [HIV] infection status: Secondary | ICD-10-CM

## 2011-05-12 DIAGNOSIS — A630 Anogenital (venereal) warts: Secondary | ICD-10-CM

## 2011-05-12 DIAGNOSIS — Z00129 Encounter for routine child health examination without abnormal findings: Secondary | ICD-10-CM

## 2011-05-12 MED ORDER — IMIQUIMOD 5 % EX CREA: TOPICAL_CREAM | CUTANEOUS | Status: DC

## 2011-05-24 ENCOUNTER — Other Ambulatory Visit: Payer: Self-pay | Admitting: Adult Health

## 2011-05-25 NOTE — Telephone Encounter (Signed)
I called it in 

## 2011-06-03 NOTE — Progress Notes (Signed)
  Subjective:    Patient ID: Nathan Maynard, male    DOB: Jun 22, 1991, 19 y.o.   MRN: [id number]  HPI Presents for f/u.  Adherent to meds with good tolerance and no complications.,  C/O increasing perianal "bumps" that are worsening and causing pruritis.   Review of Systems  Constitutional: Negative.   HENT: Negative.   Eyes: Negative.   Respiratory: Negative.   Cardiovascular: Negative.   Gastrointestinal: Positive for rectal pain.       As per HPI  Genitourinary: Negative.   Musculoskeletal: Negative.   Skin: Negative.   Neurological: Negative.   Hematological: Positive for adenopathy.  Psychiatric/Behavioral: Negative.        Objective:   Physical Exam  Constitutional: He is oriented to person, place, and time. He appears well-developed and well-nourished. No distress.  HENT:  Head: Normocephalic and atraumatic.  Right Ear: External ear normal.  Left Ear: External ear normal.  Nose: Nose normal.  Mouth/Throat: Oropharynx is clear and moist.  Eyes: Conjunctivae and EOM are normal. Pupils are equal, round, and reactive to light. Right eye exhibits no discharge. Left eye exhibits no discharge.  Neck: Normal range of motion. Neck supple. No thyromegaly present.  Cardiovascular: Normal rate, regular rhythm, normal heart sounds and intact distal pulses.   Pulmonary/Chest: Effort normal and breath sounds normal.  Abdominal: Soft. Bowel sounds are normal.  Genitourinary:       Multiple condylomatous lesions to peri-anal region of varying sizes (some very large and others small and punctate).  Musculoskeletal: Normal range of motion.  Neurological: He is alert and oriented to person, place, and time. No cranial nerve deficit. He exhibits normal muscle tone. Coordination normal.  Skin: Skin is warm and dry.  Psychiatric: He has a normal mood and affect. His behavior is normal. Judgment and thought content normal.          Assessment & Plan:  1. HIV. CD4 213 @13 % with VL <20  copies/ml.  Continued good response on regimen.  Recommend CPM, repeating labs in 10 weeks with f/u in 3 months.  If CD4 > 200 on next blood draw, will D/C PCP prophylaxis.  2. Rectal Condyloma Acuminatum:  Aldara cream q M-W-F.  Gen surg referral for eval and removal of lesions.  Verbally acknowledged all information provided to him and agreed with plan of care.

## 2011-06-22 ENCOUNTER — Other Ambulatory Visit (INDEPENDENT_AMBULATORY_CARE_PROVIDER_SITE_OTHER): Payer: Self-pay | Admitting: General Surgery

## 2011-06-22 ENCOUNTER — Ambulatory Visit (HOSPITAL_BASED_OUTPATIENT_CLINIC_OR_DEPARTMENT_OTHER)
Admission: RE | Admit: 2011-06-22 | Discharge: 2011-06-22 | Disposition: A | Discharge status: Home / Self Care | Payer: Medicaid Other | Source: Ambulatory Visit | Attending: General Surgery | Admitting: General Surgery

## 2011-06-22 DIAGNOSIS — F17200 Nicotine dependence, unspecified, uncomplicated: Secondary | ICD-10-CM | POA: Insufficient documentation

## 2011-06-22 DIAGNOSIS — Z21 Asymptomatic human immunodeficiency virus [HIV] infection status: Secondary | ICD-10-CM | POA: Insufficient documentation

## 2011-06-22 DIAGNOSIS — Z79899 Other long term (current) drug therapy: Secondary | ICD-10-CM | POA: Insufficient documentation

## 2011-06-22 DIAGNOSIS — I10 Essential (primary) hypertension: Secondary | ICD-10-CM | POA: Insufficient documentation

## 2011-06-22 DIAGNOSIS — A630 Anogenital (venereal) warts: Secondary | ICD-10-CM | POA: Insufficient documentation

## 2011-06-22 LAB — POCT HEMOGLOBIN-HEMACUE: Hemoglobin: 14.8 g/dL (ref 13.0–17.0)

## 2011-06-24 ENCOUNTER — Other Ambulatory Visit: Payer: Medicaid Other

## 2011-06-28 NOTE — Op Note (Signed)
  Nathan Maynard, Nathan Maynard              ACCOUNT NO.:  [id number]  MEDICAL RECORD NO.:  [id number]  LOCATION:                                 FACILITY:  PHYSICIAN:  Sharlet Salina T. Taggert Bozzi, M.D.DATE OF BIRTH:  Jan 07, 1991  DATE OF PROCEDURE:  06/22/2011 DATE OF DISCHARGE:                              OPERATIVE REPORT   PREOPERATIVE DIAGNOSIS:  Anal condylomata.  POSTOPERATIVE DIAGNOSIS:  Anal condylomata.  SURGICAL PROCEDURE:  Excision and fulguration of anal condylomata.  SURGEON:  Lorne Skeens. Sanjana Folz, MD  ANESTHESIA:  General.  BRIEF HISTORY:  The patient is a 20 year old male who presents with gradually worsening anal warts with pain and bleeding.  Examination in the office reveals moderate circumferential condylomata.  We discussed options and have elected to proceed with excision of fulguration in the operating room.  The nature of the procedure, expected recovery, risks of bleeding, infection, anesthetic complications and recurrence were discussed and understood.  He is now brought to the operating room for this procedure.  DESCRIPTION OF OPERATION:  The patient brought to the operating room, placed in supine position on the operating table and general anesthesia was induced.  He is carefully positioned in lithotomy position.  The perineum was sterilely prepped and draped.  Correct patient, procedure were verified.  He received preoperative antibiotics.  The anus was examined circumferentially.  There were moderate but not severe anal condylomata which virtually all were above the dentate line.  These were grouped mainly in the right posterior, right anterior, and left lateral positions.  Several of these larger groups were then excised across fairly narrow bases with cautery.  Hemostasis was obtained with cautery and one area of mucosal defect of bleeding in the left posterior area was closed with 3-0 chromic.  Several smaller condylomata were also fulgurated and destroyed.   All gross disease was removed.  Hemostasis was obtained with cautery and assured.  A perianal block with epinephrine was performed.  Lidocaine ointment and dressing was applied. The patient taken to recovery in good condition.     Lorne Skeens. Glenda Spelman, M.D.     Tory Emerald  D:  06/22/2011  T:  06/22/2011  Job:  [id number]  Electronically Signed by Glenna Fellows M.D. on 06/28/2011 03:39:05 PM

## 2011-07-01 ENCOUNTER — Other Ambulatory Visit: Payer: Self-pay | Admitting: Infectious Diseases

## 2011-07-01 ENCOUNTER — Other Ambulatory Visit: Payer: Medicaid Other

## 2011-07-01 ENCOUNTER — Telehealth (INDEPENDENT_AMBULATORY_CARE_PROVIDER_SITE_OTHER): Payer: Self-pay | Admitting: General Surgery

## 2011-07-01 DIAGNOSIS — Z79899 Other long term (current) drug therapy: Secondary | ICD-10-CM

## 2011-07-01 DIAGNOSIS — B20 Human immunodeficiency virus [HIV] disease: Secondary | ICD-10-CM

## 2011-07-02 LAB — LIPID PANEL
Cholesterol: 162 mg/dL (ref 0–200)
Triglycerides: 65 mg/dL (ref <150)
VLDL: 13 mg/dL (ref 0–40)

## 2011-07-02 LAB — CBC WITH DIFFERENTIAL/PLATELET
Basophils Absolute: 0 10*3/uL (ref 0.0–0.1)
Basophils Relative: 0 % (ref 0–1)
Eosinophils Absolute: 0.2 10*3/uL (ref 0.0–0.7)
Eosinophils Relative: 4 % (ref 0–5)
HCT: 40.8 % (ref 39.0–52.0)
Hemoglobin: 13.7 g/dL (ref 13.0–17.0)
Lymphocytes Relative: 33 % (ref 12–46)
Lymphs Abs: 1.6 10*3/uL (ref 0.7–4.0)
MCH: 31.4 pg (ref 26.0–34.0)
MCHC: 33.6 g/dL (ref 30.0–36.0)
MCV: 93.6 fL (ref 78.0–100.0)
Monocytes Absolute: 0.6 10*3/uL (ref 0.1–1.0)
Monocytes Relative: 12 % (ref 3–12)
Neutro Abs: 2.4 10*3/uL (ref 1.7–7.7)
Neutrophils Relative %: 50 % (ref 43–77)
Platelets: 256 10*3/uL (ref 150–400)
RBC: 4.36 MIL/uL (ref 4.22–5.81)
RDW: 12.5 % (ref 11.5–15.5)
WBC: 4.7 10*3/uL (ref 4.0–10.5)

## 2011-07-02 LAB — COMPLETE METABOLIC PANEL WITH GFR
ALT: 11 U/L (ref 0–53)
AST: 13 U/L (ref 0–37)
Albumin: 4.4 g/dL (ref 3.5–5.2)
Alkaline Phosphatase: 84 U/L (ref 39–117)
BUN: 8 mg/dL (ref 6–23)
CO2: 26 mEq/L (ref 19–32)
Calcium: 9.5 mg/dL (ref 8.4–10.5)
Chloride: 105 mEq/L (ref 96–112)
Creat: 1.02 mg/dL (ref 0.50–1.35)
GFR, Est African American: >60 mL/min (ref >60)
GFR, Est Non African American: >60 mL/min (ref >60)
Glucose, Bld: 78 mg/dL (ref 70–99)
Potassium: 4.1 mEq/L (ref 3.5–5.3)
Sodium: 142 mEq/L (ref 135–145)
Total Bilirubin: 3.4 mg/dL — ABNORMAL HIGH (ref 0.3–1.2)
Total Protein: 7.1 g/dL (ref 6.0–8.3)

## 2011-07-02 LAB — T-HELPER CELL (CD4) - (RCID CLINIC ONLY): CD4 % Helper T Cell: 17 % — ABNORMAL LOW (ref 33–55)

## 2011-07-02 LAB — HIV-1 RNA QUANT-NO REFLEX-BLD
HIV 1 RNA Quant: <20 copies/mL (ref <20)
HIV-1 RNA Quant, Log: <1.3 {Log} (ref <1.30)

## 2011-07-02 LAB — HEPATITIS A ANTIBODY, TOTAL: Hep A Total Ab: NEGATIVE

## 2011-07-08 ENCOUNTER — Ambulatory Visit: Payer: Medicaid Other | Admitting: Adult Health

## 2011-07-09 ENCOUNTER — Ambulatory Visit (INDEPENDENT_AMBULATORY_CARE_PROVIDER_SITE_OTHER): Payer: Medicaid Other | Admitting: General Surgery

## 2011-07-09 VITALS — Temp 97.6°F

## 2011-07-09 DIAGNOSIS — Z09 Encounter for follow-up examination after completed treatment for conditions other than malignant neoplasm: Secondary | ICD-10-CM

## 2011-07-09 NOTE — Patient Instructions (Signed)
Call as needed for problem

## 2011-07-09 NOTE — Progress Notes (Signed)
Patient returns following excision of anal condyloma. He had some expected pain and bleeding for the first couple of days but now is feeling well with no discomfort or bleeding.  On rectal exam the areas are all completely healed and there is no remaining disease seen.  Pathology revealed benign condyloma without atypia.  He is doing well and will return as needed.

## 2011-07-22 ENCOUNTER — Ambulatory Visit (INDEPENDENT_AMBULATORY_CARE_PROVIDER_SITE_OTHER): Payer: Medicaid Other | Admitting: Adult Health

## 2011-07-22 ENCOUNTER — Encounter: Payer: Self-pay | Admitting: Adult Health

## 2011-07-22 DIAGNOSIS — Z79899 Other long term (current) drug therapy: Secondary | ICD-10-CM

## 2011-07-22 DIAGNOSIS — B20 Human immunodeficiency virus [HIV] disease: Secondary | ICD-10-CM

## 2011-07-22 DIAGNOSIS — Z113 Encounter for screening for infections with a predominantly sexual mode of transmission: Secondary | ICD-10-CM

## 2011-07-22 NOTE — Progress Notes (Signed)
  Subjective:    Patient ID: Nathan Maynard, male    DOB: Feb 24, 1991, 19 y.o.   MRN: [id number]  HPI Is as to clinic for followup. Remains adherent to his antiretrovirals. Has good tolerance. No complications with medications. Had surgery to remove rectal condyloma, back in June 2012, with good recovery. Voices no new complaints at this time.   Review of Systems  Constitutional: Negative.   HENT: Negative.   Eyes: Negative.   Respiratory: Negative.   Cardiovascular: Negative.   Gastrointestinal: Negative.   Genitourinary: Negative.   Musculoskeletal: Negative.   Skin: Negative.   Neurological: Negative.   Hematological: Negative.   Psychiatric/Behavioral: Negative.        Objective:   Physical Exam  Constitutional: He is oriented to person, place, and time. He appears well-developed and well-nourished.  HENT:  Head: Normocephalic and atraumatic.  Nose: Nose normal.  Mouth/Throat: Oropharynx is clear and moist.  Eyes: Conjunctivae and EOM are normal. Pupils are equal, round, and reactive to light.  Neck: Normal range of motion. Neck supple.  Cardiovascular: Normal rate and regular rhythm.   Pulmonary/Chest: Effort normal and breath sounds normal.  Abdominal: Soft.  Genitourinary:       Healed postsurgical scars to. Anal region.  Musculoskeletal: Normal range of motion.  Neurological: He is alert and oriented to person, place, and time. No cranial nerve deficit. He exhibits normal muscle tone. Coordination normal.  Skin: Skin is warm and dry.  Psychiatric: He has a normal mood and affect. His behavior is normal. Judgment and thought content normal.          Assessment & Plan:  1. HIV. Labs obtained 07/01/2011. Show a CD4 count of 300 at 17% with a viral load less than 20 copies per mL. Continues to remain clinically stable on current regimen. Recommend discontinuing, azithromycin and Bactrim, continuing current antiretrovirals, returning to clinic for repeat labs in 3  months with a followup in 3-1/2 months.   2. Routine Health Maintenance. Repeat hepatitis A series and obtain , anal Pap on next clinic visit.  Verbally acknowledged all information that was provided to him and agreed with plan of care.

## 2011-07-22 NOTE — Patient Instructions (Signed)
Stop azithromycin, and Bactrim.

## 2011-10-19 ENCOUNTER — Other Ambulatory Visit: Payer: Medicaid Other

## 2011-10-20 ENCOUNTER — Other Ambulatory Visit: Payer: Self-pay | Admitting: Adult Health

## 2011-10-20 DIAGNOSIS — B20 Human immunodeficiency virus [HIV] disease: Secondary | ICD-10-CM

## 2011-10-20 DIAGNOSIS — J302 Other seasonal allergic rhinitis: Secondary | ICD-10-CM

## 2011-10-25 ENCOUNTER — Other Ambulatory Visit: Payer: Self-pay | Admitting: Infectious Diseases

## 2011-10-25 ENCOUNTER — Other Ambulatory Visit (INDEPENDENT_AMBULATORY_CARE_PROVIDER_SITE_OTHER): Payer: Medicaid Other

## 2011-10-25 ENCOUNTER — Other Ambulatory Visit: Payer: Self-pay | Admitting: *Deleted

## 2011-10-25 DIAGNOSIS — B20 Human immunodeficiency virus [HIV] disease: Secondary | ICD-10-CM

## 2011-10-25 DIAGNOSIS — Z79899 Other long term (current) drug therapy: Secondary | ICD-10-CM

## 2011-10-25 DIAGNOSIS — Z113 Encounter for screening for infections with a predominantly sexual mode of transmission: Secondary | ICD-10-CM

## 2011-10-25 LAB — URINALYSIS, ROUTINE W REFLEX MICROSCOPIC
Bilirubin Urine: NEGATIVE
Hgb urine dipstick: NEGATIVE
Ketones, ur: NEGATIVE mg/dL
Nitrite: NEGATIVE
Protein, ur: NEGATIVE mg/dL
Specific Gravity, Urine: 1.025 (ref 1.005–1.030)
Urobilinogen, UA: 0.2 mg/dL (ref 0.0–1.0)

## 2011-10-25 MED ORDER — CLOBETASOL PROPIONATE 0.05 % EX OINT: TOPICAL_OINTMENT | Freq: Two times a day (BID) | CUTANEOUS | Status: DC

## 2011-10-25 MED ORDER — FLUOCINOLONE ACETONIDE 0.025 % EX OINT: TOPICAL_OINTMENT | Freq: Two times a day (BID) | CUTANEOUS | Status: DC

## 2011-10-26 LAB — CBC WITH DIFFERENTIAL/PLATELET
Eosinophils Absolute: 0.3 10*3/uL (ref 0.0–0.7)
Hemoglobin: 14.5 g/dL (ref 13.0–17.0)
Lymphocytes Relative: 39 % (ref 12–46)
Lymphs Abs: 1.8 10*3/uL (ref 0.7–4.0)
MCH: 30.9 pg (ref 26.0–34.0)
Monocytes Relative: 12 % (ref 3–12)
Neutro Abs: 1.9 10*3/uL (ref 1.7–7.7)
Neutrophils Relative %: 42 % — ABNORMAL LOW (ref 43–77)
Platelets: 265 10*3/uL (ref 150–400)
RBC: 4.69 MIL/uL (ref 4.22–5.81)
WBC: 4.6 10*3/uL (ref 4.0–10.5)

## 2011-10-26 LAB — COMPLETE METABOLIC PANEL WITH GFR
ALT: 8 U/L (ref 0–53)
Albumin: 4.4 g/dL (ref 3.5–5.2)
CO2: 26 mEq/L (ref 19–32)
GFR, Est African American: >89 mL/min/{1.73_m2}
GFR, Est Non African American: >89 mL/min/{1.73_m2}
Glucose, Bld: 85 mg/dL (ref 70–99)
Potassium: 4.5 mEq/L (ref 3.5–5.3)
Sodium: 140 mEq/L (ref 135–145)
Total Bilirubin: 3 mg/dL — ABNORMAL HIGH (ref 0.3–1.2)
Total Protein: 7 g/dL (ref 6.0–8.3)

## 2011-10-26 LAB — PHOSPHORUS: Phosphorus: 3.7 mg/dL (ref 2.3–4.6)

## 2011-10-26 LAB — RPR

## 2011-10-26 LAB — GC/CHLAMYDIA PROBE AMP, URINE: Chlamydia, Swab/Urine, PCR: NEGATIVE

## 2011-11-02 ENCOUNTER — Ambulatory Visit: Payer: Medicaid Other | Admitting: Adult Health

## 2011-11-08 ENCOUNTER — Encounter: Payer: Self-pay | Admitting: Internal Medicine

## 2011-11-08 ENCOUNTER — Ambulatory Visit (INDEPENDENT_AMBULATORY_CARE_PROVIDER_SITE_OTHER): Payer: Medicaid Other | Admitting: Internal Medicine

## 2011-11-08 VITALS — BP 111/56 | HR 61 | Temp 98.3°F | Ht 67.0 in | Wt 167.0 lb

## 2011-11-08 DIAGNOSIS — B20 Human immunodeficiency virus [HIV] disease: Secondary | ICD-10-CM

## 2011-11-08 DIAGNOSIS — Z23 Encounter for immunization: Secondary | ICD-10-CM

## 2011-11-08 NOTE — Progress Notes (Signed)
  Subjective:    Patient ID: Nathan Maynard, male    DOB: 1991/08/26, 20 y.o.   MRN: [id number]  HPI Mr. Sohn is in for his routine visit. He denies any problems or concerns since his last visit. He states that he is missed about 6 doses of his HIV medicines in the past month. He states that he doesn't really know why. He has no problem obtaining the medication and tolerates it well. He usually takes his medicine about 10:00 at night. He keeps his medications in a bag in a drawer near his bed. He was given a pillbox but did not use them because he does not like to have his medication out where other people might see it. He does not want to ask his mother to remind him to take his medications and he does not want to set his cell phone a long-arm. He does not want a medication adherence counselor.    Review of Systems     Objective:   Physical Exam  Constitutional: No distress.       He makes very little eye contact during the exam and spends most of the time checking his cell phone.  HENT:  Mouth/Throat: Oropharynx is clear and moist. No oropharyngeal exudate.  Cardiovascular: Normal rate and normal heart sounds.   No murmur heard. Pulmonary/Chest: Breath sounds normal. He has no rales.  Skin:       He has hyperpigmented patches on his forearms do to his chronic eczema.  Psychiatric: He has a normal mood and affect.   HIV 1 RNA Quant (copies/mL)  Date Value  10/25/2011 NOT DETECTED   07/01/2011 <20   03/30/2011 <20      CD4 T Cell Abs (cmm)  Date Value  10/25/2011 340*  07/01/2011 300*  03/30/2011 230*            Assessment & Plan:

## 2011-11-08 NOTE — Assessment & Plan Note (Signed)
His infection is currently under good control but he has poor adherence threatens this. He is quite resistant to any help improving his adherence. I will see him back after lab work in 3 months.

## 2012-01-20 ENCOUNTER — Other Ambulatory Visit: Payer: Medicaid Other

## 2012-01-21 ENCOUNTER — Telehealth: Payer: Self-pay | Admitting: *Deleted

## 2012-01-21 NOTE — Telephone Encounter (Signed)
Called patient to remind of missed lab appointment and was told to call back and leave a message on his voice mail.

## 2012-01-27 ENCOUNTER — Other Ambulatory Visit (INDEPENDENT_AMBULATORY_CARE_PROVIDER_SITE_OTHER): Payer: Medicaid Other

## 2012-01-27 DIAGNOSIS — B20 Human immunodeficiency virus [HIV] disease: Secondary | ICD-10-CM

## 2012-01-27 DIAGNOSIS — Z113 Encounter for screening for infections with a predominantly sexual mode of transmission: Secondary | ICD-10-CM

## 2012-01-27 DIAGNOSIS — Z79899 Other long term (current) drug therapy: Secondary | ICD-10-CM

## 2012-01-27 LAB — COMPLETE METABOLIC PANEL WITH GFR
ALT: 22 U/L (ref 0–53)
AST: 16 U/L (ref 0–37)
Albumin: 4.2 g/dL (ref 3.5–5.2)
Alkaline Phosphatase: 83 U/L (ref 39–117)
Chloride: 106 mEq/L (ref 96–112)
Potassium: 4.1 mEq/L (ref 3.5–5.3)
Sodium: 141 mEq/L (ref 135–145)
Total Protein: 7.1 g/dL (ref 6.0–8.3)

## 2012-01-27 LAB — CBC WITH DIFFERENTIAL/PLATELET
Eosinophils Absolute: 0.3 10*3/uL (ref 0.0–0.7)
Eosinophils Relative: 5 % (ref 0–5)
HCT: 42.5 % (ref 39.0–52.0)
Hemoglobin: 14.3 g/dL (ref 13.0–17.0)
Lymphs Abs: 2.1 10*3/uL (ref 0.7–4.0)
MCH: 31.5 pg (ref 26.0–34.0)
MCV: 93.6 fL (ref 78.0–100.0)
Monocytes Absolute: 0.6 10*3/uL (ref 0.1–1.0)
Monocytes Relative: 9 % (ref 3–12)
Neutrophils Relative %: 54 % (ref 43–77)
RBC: 4.54 MIL/uL (ref 4.22–5.81)

## 2012-01-27 LAB — RPR

## 2012-01-27 LAB — LIPID PANEL
Total CHOL/HDL Ratio: 3.4 Ratio
VLDL: 10 mg/dL (ref 0–40)

## 2012-01-28 LAB — T-HELPER CELL (CD4) - (RCID CLINIC ONLY): CD4 T Cell Abs: 370 uL — ABNORMAL LOW (ref 400–2700)

## 2012-02-03 ENCOUNTER — Ambulatory Visit: Payer: Medicaid Other | Admitting: Internal Medicine

## 2012-02-10 ENCOUNTER — Ambulatory Visit (INDEPENDENT_AMBULATORY_CARE_PROVIDER_SITE_OTHER): Payer: Medicaid Other | Admitting: Internal Medicine

## 2012-02-10 ENCOUNTER — Ambulatory Visit: Payer: Medicaid Other | Admitting: Adult Health

## 2012-02-10 ENCOUNTER — Encounter: Payer: Self-pay | Admitting: Internal Medicine

## 2012-02-10 VITALS — BP 116/71 | HR 68 | Temp 97.9°F | Ht 67.0 in | Wt 169.0 lb

## 2012-02-10 DIAGNOSIS — Z23 Encounter for immunization: Secondary | ICD-10-CM

## 2012-02-10 DIAGNOSIS — B20 Human immunodeficiency virus [HIV] disease: Secondary | ICD-10-CM

## 2012-02-10 NOTE — Progress Notes (Signed)
  Subjective:    Patient ID: Nathan Maynard, male    DOB: 1990/12/18, 21 y.o.   MRN: [id number]  HPI here for follow up of 042.  He has been doing much better with taking his medications and really has not had any missed doses since December.  He attributes this to improved depression and acceptance of his diagnosis.  He has had no issues since his last visit.      Review of Systems  Constitutional: Negative for fever, chills, appetite change, fatigue and unexpected weight change.  HENT: Negative for sore throat and trouble swallowing.   Respiratory: Negative for cough, shortness of breath and wheezing.   Cardiovascular: Negative for chest pain, palpitations and leg swelling.  Gastrointestinal: Negative for nausea, vomiting, abdominal pain, diarrhea and constipation.  Genitourinary: Negative for discharge and genital sores.  Musculoskeletal: Negative for arthralgias.  Skin: Negative for pallor and rash.  Neurological: Negative for dizziness and headaches.  Hematological: Negative for adenopathy.  Psychiatric/Behavioral: Negative for sleep disturbance and dysphoric mood. The patient is not nervous/anxious.        Objective:   Physical Exam  Constitutional: He is oriented to person, place, and time. He appears well-developed and well-nourished. No distress.  HENT:  Mouth/Throat: Oropharynx is clear and moist. No oropharyngeal exudate.  Cardiovascular: Normal rate, regular rhythm and normal heart sounds.  Exam reveals no gallop and no friction rub.   No murmur heard. Pulmonary/Chest: Effort normal and breath sounds normal. No respiratory distress. He has no wheezes. He has no rales.  Abdominal: Soft. Bowel sounds are normal. He exhibits no distension. There is no tenderness. There is no rebound.  Lymphadenopathy:    He has no cervical adenopathy.  Neurological: He is alert and oriented to person, place, and time.  Skin: Skin is warm and dry. No rash noted. No erythema.            Assessment & Plan:

## 2012-02-10 NOTE — Assessment & Plan Note (Signed)
He is doing very well now and is taking his medicines daily.  He was reminded of how resistance develops.  He was reminded to use condos with all sex.  He will return in 3 months to assure he continues to do well.

## 2012-02-10 NOTE — Patient Instructions (Signed)
Keep it up!

## 2012-02-10 NOTE — Progress Notes (Signed)
Addended by: Wendall Mola A on: 02/10/2012 11:24 AM   Modules accepted: Orders

## 2012-02-28 ENCOUNTER — Other Ambulatory Visit: Payer: Self-pay | Admitting: *Deleted

## 2012-02-28 DIAGNOSIS — B20 Human immunodeficiency virus [HIV] disease: Secondary | ICD-10-CM

## 2012-02-28 MED ORDER — EMTRICITABINE-TENOFOVIR DF 200-300 MG PO TABS: 1.0000 | ORAL_TABLET | Freq: Every day | ORAL | Status: DC

## 2012-02-28 MED ORDER — ATAZANAVIR SULFATE 300 MG PO CAPS: 300.0000 mg | ORAL_CAPSULE | Freq: Every day | ORAL | Status: DC

## 2012-02-28 MED ORDER — RITONAVIR 100 MG PO TABS: 100.0000 mg | ORAL_TABLET | Freq: Every day | ORAL | Status: DC

## 2012-04-24 ENCOUNTER — Other Ambulatory Visit (INDEPENDENT_AMBULATORY_CARE_PROVIDER_SITE_OTHER): Payer: Self-pay

## 2012-04-24 DIAGNOSIS — B20 Human immunodeficiency virus [HIV] disease: Secondary | ICD-10-CM

## 2012-04-24 LAB — CBC WITH DIFFERENTIAL/PLATELET
Basophils Absolute: 0 10*3/uL (ref 0.0–0.1)
HCT: 44.7 % (ref 39.0–52.0)
Hemoglobin: 15.2 g/dL (ref 13.0–17.0)
Lymphocytes Relative: 44 % (ref 12–46)
Lymphs Abs: 2.9 10*3/uL (ref 0.7–4.0)
Monocytes Absolute: 0.7 10*3/uL (ref 0.1–1.0)
Neutro Abs: 2.7 10*3/uL (ref 1.7–7.7)
RBC: 4.83 MIL/uL (ref 4.22–5.81)
RDW: 12.2 % (ref 11.5–15.5)
WBC: 6.7 10*3/uL (ref 4.0–10.5)

## 2012-04-24 LAB — COMPREHENSIVE METABOLIC PANEL
ALT: 10 U/L (ref 0–53)
AST: 18 U/L (ref 0–37)
Albumin: 4.6 g/dL (ref 3.5–5.2)
BUN: 8 mg/dL (ref 6–23)
CO2: 29 mEq/L (ref 19–32)
Calcium: 9.7 mg/dL (ref 8.4–10.5)
Chloride: 103 mEq/L (ref 96–112)
Potassium: 4 mEq/L (ref 3.5–5.3)

## 2012-04-25 ENCOUNTER — Other Ambulatory Visit: Payer: Medicaid Other

## 2012-04-25 LAB — HIV-1 RNA QUANT-NO REFLEX-BLD: HIV-1 RNA Quant, Log: <1.3 {Log} (ref <1.30)

## 2012-04-25 LAB — T-HELPER CELL (CD4) - (RCID CLINIC ONLY): CD4 T Cell Abs: 490 uL (ref 400–2700)

## 2012-04-27 ENCOUNTER — Ambulatory Visit: Payer: Self-pay

## 2012-05-02 ENCOUNTER — Ambulatory Visit: Payer: Self-pay

## 2012-05-09 ENCOUNTER — Ambulatory Visit: Payer: Medicaid Other | Admitting: Internal Medicine

## 2012-05-09 ENCOUNTER — Telehealth: Payer: Self-pay | Admitting: *Deleted

## 2012-05-09 NOTE — Telephone Encounter (Signed)
Called patient to reschedule his appointment. Tried to transfer to front desk and he hung up. Wendall Mola CMA

## 2012-05-15 ENCOUNTER — Ambulatory Visit: Payer: Self-pay

## 2012-05-15 ENCOUNTER — Other Ambulatory Visit: Payer: Self-pay | Admitting: *Deleted

## 2012-05-15 DIAGNOSIS — B20 Human immunodeficiency virus [HIV] disease: Secondary | ICD-10-CM

## 2012-05-15 MED ORDER — RITONAVIR 100 MG PO TABS: 100.0000 mg | ORAL_TABLET | Freq: Every day | ORAL | Status: DC

## 2012-05-15 MED ORDER — EMTRICITABINE-TENOFOVIR DF 200-300 MG PO TABS: 1.0000 | ORAL_TABLET | Freq: Every day | ORAL | Status: DC

## 2012-05-15 MED ORDER — ATAZANAVIR SULFATE 300 MG PO CAPS: 300.0000 mg | ORAL_CAPSULE | Freq: Every day | ORAL | Status: DC

## 2012-05-29 ENCOUNTER — Other Ambulatory Visit: Payer: Self-pay | Admitting: *Deleted

## 2012-05-29 DIAGNOSIS — B20 Human immunodeficiency virus [HIV] disease: Secondary | ICD-10-CM

## 2012-05-29 DIAGNOSIS — A630 Anogenital (venereal) warts: Secondary | ICD-10-CM

## 2012-05-29 MED ORDER — ATAZANAVIR SULFATE 300 MG PO CAPS: 300.0000 mg | ORAL_CAPSULE | Freq: Every day | ORAL | Status: DC

## 2012-05-29 MED ORDER — RITONAVIR 100 MG PO TABS: 100.0000 mg | ORAL_TABLET | Freq: Every day | ORAL | Status: DC

## 2012-05-29 MED ORDER — IMIQUIMOD 5 % EX CREA: TOPICAL_CREAM | CUTANEOUS | Status: DC

## 2012-05-29 MED ORDER — EMTRICITABINE-TENOFOVIR DF 200-300 MG PO TABS: 1.0000 | ORAL_TABLET | Freq: Every day | ORAL | Status: DC

## 2012-06-13 ENCOUNTER — Telehealth: Payer: Self-pay | Admitting: *Deleted

## 2012-06-13 ENCOUNTER — Ambulatory Visit: Payer: Self-pay | Admitting: Internal Medicine

## 2012-06-13 NOTE — Telephone Encounter (Signed)
Called patient to reschedule appt, he no showed today.  Both numbers listed for patient not in service. Wendall Mola CMA

## 2012-08-03 ENCOUNTER — Other Ambulatory Visit: Payer: Self-pay | Admitting: Internal Medicine

## 2012-08-03 DIAGNOSIS — B20 Human immunodeficiency virus [HIV] disease: Secondary | ICD-10-CM

## 2012-08-10 ENCOUNTER — Other Ambulatory Visit (INDEPENDENT_AMBULATORY_CARE_PROVIDER_SITE_OTHER): Payer: Self-pay

## 2012-08-10 DIAGNOSIS — B20 Human immunodeficiency virus [HIV] disease: Secondary | ICD-10-CM

## 2012-08-11 LAB — T-HELPER CELL (CD4) - (RCID CLINIC ONLY): CD4 % Helper T Cell: 23 % — ABNORMAL LOW (ref 33–55)

## 2012-08-24 ENCOUNTER — Encounter: Payer: Self-pay | Admitting: Internal Medicine

## 2012-08-24 ENCOUNTER — Ambulatory Visit: Payer: Self-pay

## 2012-08-24 ENCOUNTER — Ambulatory Visit (INDEPENDENT_AMBULATORY_CARE_PROVIDER_SITE_OTHER): Payer: Self-pay | Admitting: Internal Medicine

## 2012-08-24 VITALS — BP 120/74 | HR 60 | Temp 97.5°F | Ht 66.0 in | Wt 156.0 lb

## 2012-08-24 DIAGNOSIS — B20 Human immunodeficiency virus [HIV] disease: Secondary | ICD-10-CM

## 2012-08-24 DIAGNOSIS — Z23 Encounter for immunization: Secondary | ICD-10-CM

## 2012-08-24 NOTE — Progress Notes (Signed)
  Subjective:    Patient ID: Nathan Maynard, male    DOB: 01-03-1991, 21 y.o.   MRN: [id number]  HPI He comes in for routine followup. He continues to have much better compliance now with this regimen and is happy to be undetectable still. He is continuing to be motivated to take his medicine every day. He does feel better and has no new issues.   Review of Systems  Constitutional: Negative for fever, appetite change and fatigue.  HENT: Negative for sore throat and trouble swallowing.   Respiratory: Negative for cough and shortness of breath.   Cardiovascular: Negative for chest pain, palpitations and leg swelling.  Gastrointestinal: Negative for nausea, abdominal pain and diarrhea.  Musculoskeletal: Negative for myalgias and arthralgias.  Neurological: Negative for dizziness and headaches.       Objective:   Physical Exam  Constitutional: He appears well-developed and well-nourished. No distress.  Cardiovascular: Normal rate, regular rhythm and normal heart sounds.   No murmur heard. Pulmonary/Chest: Effort normal and breath sounds normal. No respiratory distress. He has no wheezes. He has no rales.          Assessment & Plan:

## 2012-08-24 NOTE — Assessment & Plan Note (Signed)
He continues to do well with good compliance. No new issues. He will return in 3 months for routine followup. He does use condoms with sexual activity

## 2012-10-31 ENCOUNTER — Ambulatory Visit: Payer: Self-pay

## 2012-11-08 ENCOUNTER — Telehealth: Payer: Self-pay | Admitting: Licensed Clinical Social Worker

## 2012-11-08 NOTE — Telephone Encounter (Signed)
I spoke with Lolita Patella from [facility] and the patients phone is disconnected. They are unable to deliver medications until they speak with this patient. Will need an updated number if this patient calls.

## 2012-11-13 ENCOUNTER — Other Ambulatory Visit (INDEPENDENT_AMBULATORY_CARE_PROVIDER_SITE_OTHER): Payer: Self-pay

## 2012-11-13 ENCOUNTER — Other Ambulatory Visit: Payer: Self-pay | Admitting: Licensed Clinical Social Worker

## 2012-11-13 DIAGNOSIS — B20 Human immunodeficiency virus [HIV] disease: Secondary | ICD-10-CM

## 2012-11-13 LAB — COMPLETE METABOLIC PANEL WITH GFR
ALT: 10 U/L (ref 0–53)
AST: 15 U/L (ref 0–37)
CO2: 28 mEq/L (ref 19–32)
Calcium: 9.5 mg/dL (ref 8.4–10.5)
Chloride: 107 mEq/L (ref 96–112)
GFR, Est African American: >89 mL/min
Potassium: 4.2 mEq/L (ref 3.5–5.3)
Sodium: 140 mEq/L (ref 135–145)
Total Protein: 6.8 g/dL (ref 6.0–8.3)

## 2012-11-13 LAB — CBC WITH DIFFERENTIAL/PLATELET
Basophils Absolute: 0 10*3/uL (ref 0.0–0.1)
Lymphocytes Relative: 36 % (ref 12–46)
Lymphs Abs: 1.6 10*3/uL (ref 0.7–4.0)
MCV: 92.9 fL (ref 78.0–100.0)
Neutro Abs: 2.2 10*3/uL (ref 1.7–7.7)
Neutrophils Relative %: 49 % (ref 43–77)
Platelets: 260 10*3/uL (ref 150–400)
RBC: 4.34 MIL/uL (ref 4.22–5.81)
WBC: 4.5 10*3/uL (ref 4.0–10.5)

## 2012-11-13 MED ORDER — RITONAVIR 100 MG PO TABS: 100.0000 mg | ORAL_TABLET | Freq: Every day | ORAL | Status: DC

## 2012-11-13 MED ORDER — ATAZANAVIR SULFATE 300 MG PO CAPS: 300.0000 mg | ORAL_CAPSULE | Freq: Every day | ORAL | Status: DC

## 2012-11-13 MED ORDER — EMTRICITABINE-TENOFOVIR DF 200-300 MG PO TABS: 1.0000 | ORAL_TABLET | Freq: Every day | ORAL | Status: DC

## 2012-11-14 LAB — HIV-1 RNA QUANT-NO REFLEX-BLD
HIV 1 RNA Quant: <20 copies/mL (ref <20)
HIV-1 RNA Quant, Log: <1.3 {Log} (ref <1.30)

## 2012-11-28 ENCOUNTER — Ambulatory Visit (INDEPENDENT_AMBULATORY_CARE_PROVIDER_SITE_OTHER): Payer: Self-pay | Admitting: Internal Medicine

## 2012-11-28 ENCOUNTER — Encounter: Payer: Self-pay | Admitting: Internal Medicine

## 2012-11-28 VITALS — BP 113/66 | HR 77 | Temp 98.0°F | Ht 60.0 in | Wt 157.5 lb

## 2012-11-28 DIAGNOSIS — B20 Human immunodeficiency virus [HIV] disease: Secondary | ICD-10-CM

## 2012-11-28 DIAGNOSIS — Z113 Encounter for screening for infections with a predominantly sexual mode of transmission: Secondary | ICD-10-CM

## 2012-11-28 DIAGNOSIS — Z79899 Other long term (current) drug therapy: Secondary | ICD-10-CM

## 2012-11-28 DIAGNOSIS — F329 Major depressive disorder, single episode, unspecified: Secondary | ICD-10-CM | POA: Insufficient documentation

## 2012-11-28 DIAGNOSIS — L259 Unspecified contact dermatitis, unspecified cause: Secondary | ICD-10-CM

## 2012-11-28 MED ORDER — LORATADINE 10 MG PO TABS: 10.0000 mg | ORAL_TABLET | Freq: Every day | ORAL | Status: DC

## 2012-11-28 NOTE — Progress Notes (Signed)
  Subjective:    Patient ID: Nathan Maynard, male    DOB: 05-Feb-1991, 21 y.o.   MRN: [id number]  HPI He comes in for followup of his HIV. He is doing well with his regimen of Reyataz, Norvir and Truvada. He misses occasional doses though never 2 days in a row. He continues to struggle with depression and did miss a appointment with a counselor. No suicidal ideation. He also continues to struggle with his eczema. The topical steroids have been less effective recently. He does not use Eucerin lotion.   Review of Systems  Constitutional: Negative for fever, chills and fatigue.  HENT: Negative for sore throat and trouble swallowing.   Gastrointestinal: Negative for nausea, abdominal pain and diarrhea.  Musculoskeletal: Negative for myalgias, joint swelling and arthralgias.  Neurological: Negative for dizziness and headaches.  Psychiatric/Behavioral: Positive for dysphoric mood.       Objective:   Physical Exam  Constitutional: He appears well-developed and well-nourished. No distress.  HENT:  Mouth/Throat: Oropharynx is clear and moist. No oropharyngeal exudate.  Cardiovascular: Normal rate, regular rhythm and normal heart sounds.  Exam reveals no gallop and no friction rub.   No murmur heard. Pulmonary/Chest: Effort normal and breath sounds normal. No respiratory distress. He has no wheezes. He has no rales.          Assessment & Plan:

## 2012-11-28 NOTE — Assessment & Plan Note (Signed)
He continues to struggle with some depression. He is sleeping in excessive amount. He will follow up with a counselor.

## 2012-11-28 NOTE — Assessment & Plan Note (Signed)
He is doing well his HIV we'll continue with the same regimen. He will followup in 4 months was sure he continues to do well.

## 2012-11-28 NOTE — Assessment & Plan Note (Signed)
I discussed with him different methods of taking care of eczema. I discussed that hygiene to help decrease the amount of dryness in the skin. Assessment discussed using Eucerin lotion and methods to moisturize his skin. I am also going to give him a prescription for Claritin for 4$ pharmacy to try to relieve some of the itching without the sedating effect.

## 2012-12-05 ENCOUNTER — Encounter (HOSPITAL_COMMUNITY): Payer: Self-pay | Admitting: Emergency Medicine

## 2012-12-05 ENCOUNTER — Emergency Department (INDEPENDENT_AMBULATORY_CARE_PROVIDER_SITE_OTHER)
Admission: EM | Admit: 2012-12-05 | Discharge: 2012-12-05 | Disposition: A | Discharge status: Home / Self Care | Payer: Self-pay | Source: Home / Self Care | Attending: Emergency Medicine | Admitting: Emergency Medicine

## 2012-12-05 ENCOUNTER — Other Ambulatory Visit (HOSPITAL_COMMUNITY)
Admission: RE | Admit: 2012-12-05 | Discharge: 2012-12-05 | Disposition: A | Discharge status: Home / Self Care | Payer: Self-pay | Source: Ambulatory Visit | Attending: Emergency Medicine | Admitting: Emergency Medicine

## 2012-12-05 DIAGNOSIS — Z113 Encounter for screening for infections with a predominantly sexual mode of transmission: Secondary | ICD-10-CM | POA: Insufficient documentation

## 2012-12-05 DIAGNOSIS — R369 Urethral discharge, unspecified: Secondary | ICD-10-CM

## 2012-12-05 MED ORDER — AZITHROMYCIN 250 MG PO TABS
ORAL_TABLET | ORAL | Status: AC
  Filled 2012-12-05: qty 4

## 2012-12-05 MED ORDER — CEFTRIAXONE SODIUM 250 MG IJ SOLR
INTRAMUSCULAR | Status: AC
  Filled 2012-12-05: qty 250

## 2012-12-05 MED ORDER — CEFTRIAXONE SODIUM 250 MG IJ SOLR
250.0000 mg | Freq: Once | INTRAMUSCULAR | Status: AC
  Administered 2012-12-05: 250 mg via INTRAMUSCULAR

## 2012-12-05 MED ORDER — LIDOCAINE HCL (PF) 1 % IJ SOLN
INTRAMUSCULAR | Status: AC
  Filled 2012-12-05: qty 5

## 2012-12-05 MED ORDER — AZITHROMYCIN 250 MG PO TABS
1000.0000 mg | ORAL_TABLET | Freq: Once | ORAL | Status: AC
  Administered 2012-12-05: 1000 mg via ORAL

## 2012-12-05 NOTE — ED Provider Notes (Signed)
History     CSN: [id number]  Arrival date & time 12/05/12  1231   First MD Initiated Contact with Patient 12/05/12 1310      Chief Complaint  Patient presents with  . Exposure to STD    (Consider location/radiation/quality/duration/timing/severity/associated sxs/prior treatment) HPI Comments: For about 2-3 days been having a "penile discharge" yellow and clear...with discomfort with urination "burning" I did have unprotected sex No  abdominal pain. No rashes  Patient is a 21 y.o. male presenting with STD exposure. The history is provided by the patient.  Exposure to STD This is a recurrent problem. The current episode started more than 2 days ago. The problem occurs constantly. The problem has been gradually worsening. Pertinent negatives include no abdominal pain. He has tried nothing for the symptoms.    Past Medical History  Diagnosis Date  . HIV infection     History reviewed. No pertinent past surgical history.  No family history on file.  History  Substance Use Topics  . Smoking status: Current Every Day Smoker -- 6 years    Types: Cigarettes    Start date: 12/14/2011  . Smokeless tobacco: Never Used  . Alcohol Use: Yes     Comment: occasional      Review of Systems  Constitutional: Positive for activity change. Negative for fever and chills.  Gastrointestinal: Negative for abdominal pain.  Genitourinary: Positive for dysuria and discharge. Negative for flank pain, decreased urine volume, penile swelling, scrotal swelling, difficulty urinating, genital sores and testicular pain.  Skin: Negative for rash.    Allergies  Review of patient's allergies indicates no known allergies.  Home Medications   Current Outpatient Rx  Name  Route  Sig  Dispense  Refill  . ATAZANAVIR SULFATE 300 MG PO CAPS   Oral   Take 1 capsule (300 mg total) by mouth daily.   30 capsule   3   . EMTRICITABINE-TENOFOVIR 200-300 MG PO TABS   Oral   Take 1 tablet by mouth  daily.   30 tablet   3   . RITONAVIR 100 MG PO TABS   Oral   Take 1 tablet (100 mg total) by mouth daily.   30 tablet   3   . ALLERGY RELIEF 10 MG PO TABS      TAKE 1 TABLET BY MOUTH EVERY DAY   30 tablet   3   . CLOBETASOL PROPIONATE 0.05 % EX OINT   Topical   Apply topically 2 (two) times daily. Apply to thick places on arms and trunk.  Do not apply to face or groin.   60 g   2     Pt needs to keep upcoming lab and MD appointments.   Marland Kitchen FLUOCINOLONE ACETONIDE 0.025 % EX OINT   Topical   Apply topically 2 (two) times daily. Apply to affected area on face for dry spots.   60 g   2     Pt needs to keep upcoming lab and MD appointments.   Marland Kitchen LORATADINE 10 MG PO TABS   Oral   Take 1 tablet (10 mg total) by mouth daily.   30 tablet   5     BP 113/65  Pulse 70  Temp 98.2 F (36.8 C) (Oral)  Resp 16  SpO2 100%  Physical Exam  Nursing note and vitals reviewed. Constitutional: He appears well-developed and well-nourished.  Genitourinary: Testes normal. No paraphimosis, hypospadias, penile erythema or penile tenderness. Discharge found.  Neurological: He is alert.  Skin: No rash noted. No erythema.    ED Course  Procedures (including critical care time)   Labs Reviewed  URINE CYTOLOGY ANCILLARY ONLY   No results found.   No diagnosis found.    MDM   Penile discharge treated today and screening for STD performed- advised patient about safe sexual practices (even if partner HIV +) advised about resistant HIV strains etc..Jimmie Molly, MD 12/05/12 1321

## 2012-12-05 NOTE — ED Notes (Signed)
Pt c/o penile discharge x2 days... Discharge is a clear/yellow... Denies: fevers, vomiting, nauseas, diarrhea... He is alert w/no signs of acute distress.

## 2012-12-15 ENCOUNTER — Telehealth (HOSPITAL_COMMUNITY): Payer: Self-pay | Admitting: *Deleted

## 2012-12-15 NOTE — ED Notes (Signed)
Pt. Called back.  Pt. verified x 2 and given results.  Pt. told he was adequately treated with Rocephin and Zithromax.  Pt. instructed to notify his partner, no sex for 1 week (time elapsed now) and to practice safe sex. Pt. told he can get HIV testing at the [facility]. STD clinic  by appointment.  Pt. voiced understanding. Vassie Moselle 12/15/2012

## 2012-12-15 NOTE — ED Notes (Signed)
GC pos., Chlamydia neg., Trich neg.,   Pt. adequately treated with Rocephin and Zithromax.  I called cell # but was unavailable.  I called home # and left a message.  [facility]HS form completed and faxed to the [facility]. Vassie Moselle 12/15/2012

## 2012-12-21 ENCOUNTER — Telehealth: Payer: Self-pay | Admitting: *Deleted

## 2012-12-21 NOTE — Telephone Encounter (Signed)
[facility] called to verify numbers and advise they have not been able to contact the patient to deliver medication. Advised will call the emergency contact listed for the patient and try to get an active number or get the patient a message to call about his medication. His aunt gave new number for the patient as [phone] called and left a number for the patient to call the office or the pharmacy asap.

## 2013-03-12 ENCOUNTER — Ambulatory Visit: Payer: Self-pay

## 2013-03-14 ENCOUNTER — Ambulatory Visit: Payer: Self-pay

## 2013-03-14 ENCOUNTER — Other Ambulatory Visit (INDEPENDENT_AMBULATORY_CARE_PROVIDER_SITE_OTHER): Payer: Self-pay

## 2013-03-14 ENCOUNTER — Other Ambulatory Visit: Payer: Self-pay | Admitting: Infectious Disease

## 2013-03-14 DIAGNOSIS — Z113 Encounter for screening for infections with a predominantly sexual mode of transmission: Secondary | ICD-10-CM

## 2013-03-14 DIAGNOSIS — Z79899 Other long term (current) drug therapy: Secondary | ICD-10-CM

## 2013-03-14 DIAGNOSIS — B20 Human immunodeficiency virus [HIV] disease: Secondary | ICD-10-CM

## 2013-03-14 LAB — COMPLETE METABOLIC PANEL WITH GFR
BUN: 12 mg/dL (ref 6–23)
CO2: 28 mEq/L (ref 19–32)
Calcium: 9.3 mg/dL (ref 8.4–10.5)
Chloride: 105 mEq/L (ref 96–112)
Creat: 1.08 mg/dL (ref 0.50–1.35)
GFR, Est African American: >89 mL/min

## 2013-03-14 LAB — CBC WITH DIFFERENTIAL/PLATELET
Eosinophils Relative: 4 % (ref 0–5)
HCT: 42.3 % (ref 39.0–52.0)
Lymphocytes Relative: 37 % (ref 12–46)
Lymphs Abs: 2 10*3/uL (ref 0.7–4.0)
MCV: 93.2 fL (ref 78.0–100.0)
Monocytes Absolute: 0.6 10*3/uL (ref 0.1–1.0)
RBC: 4.54 MIL/uL (ref 4.22–5.81)
RDW: 13.8 % (ref 11.5–15.5)
WBC: 5.2 10*3/uL (ref 4.0–10.5)

## 2013-03-14 LAB — LIPID PANEL
Cholesterol: 178 mg/dL (ref 0–200)
LDL Cholesterol: 113 mg/dL — ABNORMAL HIGH (ref 0–99)
Triglycerides: 57 mg/dL (ref <150)

## 2013-03-15 LAB — T-HELPER CELL (CD4) - (RCID CLINIC ONLY): CD4 % Helper T Cell: 18 % — ABNORMAL LOW (ref 33–55)

## 2013-03-20 ENCOUNTER — Other Ambulatory Visit: Payer: Self-pay

## 2013-04-02 ENCOUNTER — Encounter: Payer: Self-pay | Admitting: *Deleted

## 2013-04-03 ENCOUNTER — Ambulatory Visit (INDEPENDENT_AMBULATORY_CARE_PROVIDER_SITE_OTHER): Payer: Self-pay | Admitting: Internal Medicine

## 2013-04-03 ENCOUNTER — Encounter: Payer: Self-pay | Admitting: Internal Medicine

## 2013-04-03 VITALS — BP 112/72 | HR 49 | Temp 98.5°F | Ht 67.0 in | Wt 151.0 lb

## 2013-04-03 DIAGNOSIS — F329 Major depressive disorder, single episode, unspecified: Secondary | ICD-10-CM

## 2013-04-03 DIAGNOSIS — B20 Human immunodeficiency virus [HIV] disease: Secondary | ICD-10-CM

## 2013-04-03 DIAGNOSIS — L259 Unspecified contact dermatitis, unspecified cause: Secondary | ICD-10-CM

## 2013-04-03 DIAGNOSIS — Z23 Encounter for immunization: Secondary | ICD-10-CM

## 2013-04-03 MED ORDER — TRIAMCINOLONE ACETONIDE 0.5 % EX CREA: TOPICAL_CREAM | Freq: Two times a day (BID) | CUTANEOUS | Status: DC

## 2013-04-03 NOTE — Assessment & Plan Note (Signed)
He is doing well despite his sporadic followup and depression. He is going to be scheduled today to see a counselor I will have him return in about 3 months. He knows to call if he has any problems otherwise.

## 2013-04-03 NOTE — Assessment & Plan Note (Signed)
He continues to have problems with depression and will be scheduled with the counselor

## 2013-04-03 NOTE — Progress Notes (Signed)
  Subjective:    Patient ID: Nathan Maynard, male    DOB: 1991-11-22, 22 y.o.   MRN: [id number]  HPI He comes in for follow up of HIV. He continues on Reyataz, Norvir and Truvada. He does have occasional missed doses. He also has sporadic followup however despite this has good viral suppression and his CD4 count is stable. He feels well otherwise medically though does struggle still with depression. He does tell me he gets racing thoughts. He denies any significant drug use. No suicidal ideation. He is interested in counseling.   Review of Systems  Constitutional: Negative for fever, fatigue and unexpected weight change.  HENT: Negative for sore throat and trouble swallowing.   Respiratory: Negative for cough and shortness of breath.   Cardiovascular: Negative for chest pain.  Gastrointestinal: Negative for nausea, abdominal pain and diarrhea.  Musculoskeletal: Negative for myalgias, joint swelling and arthralgias.  Skin: Negative for rash.  Neurological: Negative for dizziness, light-headedness and headaches.  Psychiatric/Behavioral: Positive for dysphoric mood. Negative for suicidal ideas. The patient is nervous/anxious.        Objective:   Physical Exam  Constitutional: He appears well-developed and well-nourished. No distress.  HENT:  Mouth/Throat: Oropharynx is clear and moist. No oropharyngeal exudate.  Cardiovascular: Normal rate, regular rhythm and normal heart sounds.  Exam reveals no gallop and no friction rub.   No murmur heard. Pulmonary/Chest: Effort normal and breath sounds normal. No respiratory distress. He has no wheezes. He has no rales.  Psychiatric: He has a normal mood and affect. His behavior is normal.          Assessment & Plan:

## 2013-04-03 NOTE — Assessment & Plan Note (Signed)
He does continue to have problems with eczema. I have refilled his steroid cream. He does have a lotion that he uses and I discussed proper application

## 2013-04-09 ENCOUNTER — Other Ambulatory Visit: Payer: Self-pay | Admitting: Internal Medicine

## 2013-04-09 ENCOUNTER — Ambulatory Visit: Payer: Self-pay

## 2013-04-09 DIAGNOSIS — F331 Major depressive disorder, recurrent, moderate: Secondary | ICD-10-CM

## 2013-04-09 MED ORDER — CITALOPRAM HYDROBROMIDE 20 MG PO TABS: 20.0000 mg | ORAL_TABLET | Freq: Every day | ORAL | Status: DC

## 2013-04-09 MED ORDER — TRAZODONE HCL 50 MG PO TABS: 50.0000 mg | ORAL_TABLET | Freq: Every day | ORAL | Status: DC

## 2013-04-09 NOTE — Progress Notes (Signed)
I met with Nathan Maynard for the first time today and he reports depressed mood, anger outbursts, poor sleep (2-4 hours/night), racing thoughts, poor concentration, feelings of hopelessness, and passive suicidal thoughts on occasion with no intent or plan.  It sounds like he has been struggling with this for some time now.  He reports having struggled in high school, having an IEP due to problems with attention and poor reading skills.  He took Adderall he reports, but did not like it.  He is currently attending GED/ABE classes at [employer] and says the math is a breeze, but he continues to struggle with reading comprehension.  He want to be an Art gallery manager.  He was on probation several years ago for misdemeanor B&E and larceny, saying that he was involved but did not instigate it.  He says people say he is "bipolar", but after assessing him, I am not convinced of this.  He does have occasional bursts of energy, lasting 2-3 hours, he reports, then he comes down and is quiet.  He denies any history of trauma.   No evidence of anxiety or panic.  He reports having a difficult time finding employment and says he has never worked a job in his life.  I explained the basics of CBT to him - mainly how thoughts influence feelings and how negative distortions in thinking can cause negative emotions.  He responded well to this.  We also discussed medication and he said he would be willing to try an antidepressant if it made things better and not worse.  He also would consider taking something for sleep.  I plan to discuss this with his doctor.  Plan to meet again in a week and a half.

## 2013-04-11 ENCOUNTER — Other Ambulatory Visit: Payer: Self-pay | Admitting: *Deleted

## 2013-04-11 DIAGNOSIS — B20 Human immunodeficiency virus [HIV] disease: Secondary | ICD-10-CM

## 2013-04-11 MED ORDER — ATAZANAVIR SULFATE 300 MG PO CAPS: 300.0000 mg | ORAL_CAPSULE | Freq: Every day | ORAL | Status: DC

## 2013-04-11 MED ORDER — RITONAVIR 100 MG PO TABS: 100.0000 mg | ORAL_TABLET | Freq: Every day | ORAL | Status: DC

## 2013-04-11 MED ORDER — EMTRICITABINE-TENOFOVIR DF 200-300 MG PO TABS: 1.0000 | ORAL_TABLET | Freq: Every day | ORAL | Status: DC

## 2013-04-19 ENCOUNTER — Ambulatory Visit: Payer: Self-pay

## 2013-06-19 ENCOUNTER — Other Ambulatory Visit: Payer: Self-pay

## 2013-06-20 ENCOUNTER — Other Ambulatory Visit (INDEPENDENT_AMBULATORY_CARE_PROVIDER_SITE_OTHER): Payer: Self-pay

## 2013-06-20 DIAGNOSIS — B20 Human immunodeficiency virus [HIV] disease: Secondary | ICD-10-CM

## 2013-06-21 LAB — T-HELPER CELL (CD4) - (RCID CLINIC ONLY)
CD4 % Helper T Cell: 26 % — ABNORMAL LOW (ref 33–55)
CD4 T Cell Abs: 510 uL (ref 400–2700)

## 2013-06-21 LAB — HIV-1 RNA QUANT-NO REFLEX-BLD: HIV 1 RNA Quant: <20 copies/mL (ref <20)

## 2013-07-03 ENCOUNTER — Ambulatory Visit: Payer: Self-pay | Admitting: Internal Medicine

## 2013-07-10 ENCOUNTER — Ambulatory Visit: Payer: Self-pay | Admitting: Internal Medicine

## 2013-07-10 ENCOUNTER — Ambulatory Visit: Payer: Self-pay

## 2013-07-12 ENCOUNTER — Encounter: Payer: Self-pay | Admitting: Internal Medicine

## 2013-07-12 ENCOUNTER — Ambulatory Visit (INDEPENDENT_AMBULATORY_CARE_PROVIDER_SITE_OTHER): Payer: Self-pay | Admitting: Internal Medicine

## 2013-07-12 VITALS — BP 113/69 | HR 59 | Temp 97.9°F | Ht 67.0 in | Wt 148.0 lb

## 2013-07-12 DIAGNOSIS — F329 Major depressive disorder, single episode, unspecified: Secondary | ICD-10-CM

## 2013-07-12 DIAGNOSIS — B20 Human immunodeficiency virus [HIV] disease: Secondary | ICD-10-CM

## 2013-07-12 NOTE — Assessment & Plan Note (Signed)
He is going to call to schedule new appointment again. Will continue to watch his weight.

## 2013-07-12 NOTE — Progress Notes (Signed)
  Subjective:    Patient ID: Nathan Maynard, male    DOB: [birth year], 22 y.o.   MRN: [id number]  HPI Comes in for routine followup. He continues on Reyataz, Norvir and Truvada. He endorses excellent compliance with only one missed dose. He feels well and is pleased with his regimen. He does have a poor appetite and has had some weight loss.  He does attribute his appetite and weight loss 2 depression. He is going to call the counselor again after having missed a followup appointment with him previously.   Review of Systems  Constitutional: Negative for fever, fatigue and unexpected weight change.  HENT: Negative for sore throat and trouble swallowing.   Eyes: Negative for visual disturbance.  Respiratory: Negative for shortness of breath.   Cardiovascular: Negative for leg swelling.  Gastrointestinal: Negative for nausea, abdominal pain and diarrhea.  Musculoskeletal: Negative for myalgias.  Skin: Negative for rash.  Neurological: Negative for dizziness, light-headedness and headaches.  Hematological: Negative for adenopathy.  Psychiatric/Behavioral: Negative for dysphoric mood.       Objective:   Physical Exam  Constitutional: He is oriented to person, place, and time. He appears well-developed and well-nourished. No distress.  HENT:  Mouth/Throat: No oropharyngeal exudate.  Eyes: Right eye exhibits no discharge. Left eye exhibits no discharge. No scleral icterus.  Cardiovascular: Normal rate, regular rhythm and normal heart sounds.   No murmur heard. Pulmonary/Chest: Effort normal and breath sounds normal. No respiratory distress. He has no wheezes.  Lymphadenopathy:    He has no cervical adenopathy.  Neurological: He is alert and oriented to person, place, and time.  Skin: Skin is warm and dry. No rash noted.          Assessment & Plan:

## 2013-07-12 NOTE — Assessment & Plan Note (Signed)
He does report doing well and his labs to confirm this. He will return in 4 months to

## 2013-07-13 ENCOUNTER — Telehealth: Payer: Self-pay | Admitting: Family Medicine

## 2013-07-13 NOTE — Telephone Encounter (Signed)
Pt called to report penile discharge, but states he was trying to get in contact with the infectious disease doctor (pt reportedly called ID clinic and was directed to [phone], then I received a page to his number). Pt is not a patient of the [facility] practice, but nevertheless advised pt to go to the ED or urgent care if he is having an emergent problem. He states he can't go to ED or urgent care due to having no insurance; he states "I only have insurance through my doctor." Pt advised to call his doctor's office/[phone] again to be put in contact with the proper providers.  Bobbye Morton, MD PGY-2, [facility] 07/13/2013, 11:22 PM

## 2013-07-14 ENCOUNTER — Encounter (HOSPITAL_COMMUNITY): Payer: Self-pay | Admitting: Adult Health

## 2013-07-14 ENCOUNTER — Emergency Department (HOSPITAL_COMMUNITY)
Admission: EM | Admit: 2013-07-14 | Discharge: 2013-07-14 | Disposition: A | Discharge status: Home / Self Care | Payer: Self-pay | Attending: Emergency Medicine | Admitting: Emergency Medicine

## 2013-07-14 DIAGNOSIS — A6002 Herpesviral infection of other male genital organs: Secondary | ICD-10-CM

## 2013-07-14 DIAGNOSIS — Z79899 Other long term (current) drug therapy: Secondary | ICD-10-CM | POA: Insufficient documentation

## 2013-07-14 DIAGNOSIS — A6000 Herpesviral infection of urogenital system, unspecified: Secondary | ICD-10-CM | POA: Insufficient documentation

## 2013-07-14 DIAGNOSIS — R300 Dysuria: Secondary | ICD-10-CM | POA: Insufficient documentation

## 2013-07-14 DIAGNOSIS — A64 Unspecified sexually transmitted disease: Secondary | ICD-10-CM | POA: Insufficient documentation

## 2013-07-14 DIAGNOSIS — Z21 Asymptomatic human immunodeficiency virus [HIV] infection status: Secondary | ICD-10-CM | POA: Insufficient documentation

## 2013-07-14 DIAGNOSIS — F17200 Nicotine dependence, unspecified, uncomplicated: Secondary | ICD-10-CM | POA: Insufficient documentation

## 2013-07-14 MED ORDER — METRONIDAZOLE 500 MG PO TABS
2000.0000 mg | ORAL_TABLET | Freq: Once | ORAL | Status: AC
  Administered 2013-07-14: 2000 mg via ORAL
  Filled 2013-07-14: qty 4

## 2013-07-14 MED ORDER — VALACYCLOVIR HCL 1 G PO TABS: 1000.0000 mg | ORAL_TABLET | Freq: Two times a day (BID) | ORAL | Status: AC

## 2013-07-14 MED ORDER — AZITHROMYCIN 250 MG PO TABS
1000.0000 mg | ORAL_TABLET | Freq: Once | ORAL | Status: AC
  Administered 2013-07-14: 1000 mg via ORAL
  Filled 2013-07-14: qty 4

## 2013-07-14 MED ORDER — LIDOCAINE HCL (PF) 1 % IJ SOLN
INTRAMUSCULAR | Status: AC
  Administered 2013-07-14: 5 mL
  Filled 2013-07-14: qty 5

## 2013-07-14 MED ORDER — CEFTRIAXONE SODIUM 250 MG IJ SOLR
250.0000 mg | Freq: Once | INTRAMUSCULAR | Status: AC
  Administered 2013-07-14: 250 mg via INTRAMUSCULAR
  Filled 2013-07-14: qty 250

## 2013-07-14 NOTE — ED Provider Notes (Signed)
Medical screening examination/treatment/procedure(s) were performed by non-physician practitioner and as supervising physician I was immediately available for consultation/collaboration.  John-Adam Macklen Wilhoite, M.D.     John-Adam Sadaf Przybysz, MD 07/14/13 0228 

## 2013-07-14 NOTE — ED Provider Notes (Signed)
CSN: [id number]     Arrival date & time 07/14/13  0020 History     First MD Initiated Contact with Patient 07/14/13 0038     Chief Complaint  Patient presents with  . Penile Discharge   HPI  History provided by the patient. The patient is a 22 year old male with history of HIV who is bisexual and presents with complaints of penile discharge. Patient reports having some slight penile irritation one to 2 days ago. Yesterday he began having yellow discharge from the penis increased sensitivity. He denies having any dysuria or urinary frequency. He does have past history of STD infections including trichomonas and gonorrhea. Patient is followed by ID in taking medications regularly. His last CD4 count was 510 yesterday. Denies any associated fever, chills or sweats. No other aggravating or alleviating factors. No other associated symptoms.    Past Medical History  Diagnosis Date  . HIV infection    History reviewed. No pertinent past surgical history. History reviewed. No pertinent family history. History  Substance Use Topics  . Smoking status: Current Some Day Smoker -- 0.30 packs/day for 6 years    Types: Cigarettes  . Smokeless tobacco: Never Used  . Alcohol Use: Yes     Comment: occasional    Review of Systems  Constitutional: Negative for fever, chills and diaphoresis.  Genitourinary: Positive for dysuria, discharge and penile pain.  All other systems reviewed and are negative.    Allergies  Review of patient's allergies indicates no known allergies.  Home Medications   Current Outpatient Rx  Name  Route  Sig  Dispense  Refill  . atazanavir (REYATAZ) 300 MG capsule   Oral   Take 1 capsule (300 mg total) by mouth daily.   30 capsule   5   . citalopram (CELEXA) 20 MG tablet   Oral   Take 1 tablet (20 mg total) by mouth daily.   30 tablet   5   . clobetasol (TEMOVATE) 0.05 % ointment   Topical   Apply topically 2 (two) times daily. Apply to thick places on arms  and trunk.  Do not apply to face or groin.   60 g   2     Pt needs to keep upcoming lab and MD appointments.   Marland Kitchen emtricitabine-tenofovir (TRUVADA) 200-300 MG per tablet   Oral   Take 1 tablet by mouth daily.   30 tablet   5   . fluocinolone (SYNALAR) 0.025 % ointment   Topical   Apply topically 2 (two) times daily. Apply to affected area on face for dry spots.   60 g   2     Pt needs to keep upcoming lab and MD appointments.   . ritonavir (NORVIR) 100 MG TABS   Oral   Take 1 tablet (100 mg total) by mouth daily.   30 tablet   5   . traZODone (DESYREL) 50 MG tablet   Oral   Take 1 tablet (50 mg total) by mouth at bedtime.   30 tablet   5   . triamcinolone cream (KENALOG) 0.5 %   Topical   Apply topically 2 (two) times daily. As needed for eczema   30 g   2    BP 107/63  Pulse 66  Temp(Src) 97.8 F (36.6 C) (Oral)  Resp 16  SpO2 100% Physical Exam  Nursing note and vitals reviewed. Constitutional: He is oriented to person, place, and time. He appears well-developed and well-nourished. No distress.  HENT:  Head: Normocephalic.  Cardiovascular: Normal rate and regular rhythm.   Pulmonary/Chest: Effort normal and breath sounds normal. No respiratory distress.  Abdominal: Soft.  Genitourinary: Testes normal.  Thick yellow penile discharge. Small vesicle with surrounding erythema to the lower right side of urethral opening on the glands.  Lymphadenopathy:       Right: Inguinal adenopathy present.       Left: Inguinal adenopathy present.  Neurological: He is alert and oriented to person, place, and time.  Skin: Skin is warm.  Psychiatric: He has a normal mood and affect. His behavior is normal.    ED Course   Procedures    Medications  cefTRIAXone (ROCEPHIN) injection 250 mg (not administered)  azithromycin (ZITHROMAX) tablet 1,000 mg (not administered)  metroNIDAZOLE (FLAGYL) tablet 2,000 mg (not administered)       1. 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 Patient appears well no acute distress. Does not appear severely ill or toxic.  Last cd4 was 510 yesterday.  Angus Seller, PA-C 07/14/13 [phone]

## 2013-07-14 NOTE — ED Notes (Signed)
Presents with onset of penile discharge that began today, yellow in color associated with burning and pain. Unprotected sex a couple of days with the same partner.

## 2013-07-18 NOTE — ED Notes (Signed)
+   Chlamydia + Gonorrhea Patient treated with Rocephin And Zithromax-[facility]HS faxed 

## 2013-07-19 ENCOUNTER — Ambulatory Visit: Payer: Self-pay

## 2013-09-11 ENCOUNTER — Ambulatory Visit: Payer: Self-pay

## 2013-09-12 ENCOUNTER — Ambulatory Visit: Payer: Self-pay | Admitting: Internal Medicine

## 2013-10-08 ENCOUNTER — Other Ambulatory Visit: Payer: Self-pay | Admitting: *Deleted

## 2013-10-08 DIAGNOSIS — B20 Human immunodeficiency virus [HIV] disease: Secondary | ICD-10-CM

## 2013-10-08 MED ORDER — RITONAVIR 100 MG PO TABS: 100.0000 mg | ORAL_TABLET | Freq: Every day | ORAL | Status: DC

## 2013-10-08 MED ORDER — ATAZANAVIR SULFATE 300 MG PO CAPS: 300.0000 mg | ORAL_CAPSULE | Freq: Every day | ORAL | Status: DC

## 2013-10-08 MED ORDER — EMTRICITABINE-TENOFOVIR DF 200-300 MG PO TABS: 1.0000 | ORAL_TABLET | Freq: Every day | ORAL | Status: DC

## 2013-10-18 ENCOUNTER — Other Ambulatory Visit: Payer: Self-pay

## 2013-11-13 ENCOUNTER — Other Ambulatory Visit: Payer: Self-pay

## 2013-11-19 ENCOUNTER — Other Ambulatory Visit (INDEPENDENT_AMBULATORY_CARE_PROVIDER_SITE_OTHER): Payer: Self-pay

## 2013-11-19 DIAGNOSIS — B20 Human immunodeficiency virus [HIV] disease: Secondary | ICD-10-CM

## 2013-11-20 LAB — T-HELPER CELL (CD4) - (RCID CLINIC ONLY): CD4 % Helper T Cell: 22 % — ABNORMAL LOW (ref 33–55)

## 2013-11-20 LAB — HIV-1 RNA QUANT-NO REFLEX-BLD: HIV-1 RNA Quant, Log: <1.3 {Log} (ref <1.30)

## 2013-11-27 ENCOUNTER — Ambulatory Visit: Payer: Self-pay | Admitting: Internal Medicine

## 2013-12-04 ENCOUNTER — Encounter: Payer: Self-pay | Admitting: Internal Medicine

## 2013-12-04 ENCOUNTER — Ambulatory Visit (INDEPENDENT_AMBULATORY_CARE_PROVIDER_SITE_OTHER): Payer: Self-pay | Admitting: Internal Medicine

## 2013-12-04 VITALS — BP 114/65 | HR 69 | Temp 97.7°F | Wt 159.0 lb

## 2013-12-04 DIAGNOSIS — F32A Depression, unspecified: Secondary | ICD-10-CM

## 2013-12-04 DIAGNOSIS — B20 Human immunodeficiency virus [HIV] disease: Secondary | ICD-10-CM

## 2013-12-04 DIAGNOSIS — Z113 Encounter for screening for infections with a predominantly sexual mode of transmission: Secondary | ICD-10-CM

## 2013-12-04 DIAGNOSIS — Z79899 Other long term (current) drug therapy: Secondary | ICD-10-CM

## 2013-12-04 DIAGNOSIS — F329 Major depressive disorder, single episode, unspecified: Secondary | ICD-10-CM

## 2013-12-04 DIAGNOSIS — Z23 Encounter for immunization: Secondary | ICD-10-CM

## 2013-12-04 DIAGNOSIS — M2632 Excessive spacing of fully erupted teeth: Secondary | ICD-10-CM

## 2013-12-04 NOTE — Assessment & Plan Note (Signed)
Will get him to dental

## 2013-12-04 NOTE — Assessment & Plan Note (Signed)
Doing well despite some missed doses. I reminded him of the importance of compliance.

## 2013-12-04 NOTE — Progress Notes (Signed)
  Subjective:    Patient ID: Nathan Maynard, male    DOB: 1991-03-09, 22 y.o.   MRN: [id number]  HPI  Comes in for routine followup. He continues on Reyataz, Norvir and Truvada. He does tell me he misses occasional doses. He feels well and is pleased with his regimen. His depression is much better.  He does have pain in his left lower tooth.     Review of Systems  Constitutional: Negative for fever, fatigue and unexpected weight change.  HENT: Negative for sore throat and trouble swallowing.   Eyes: Negative for visual disturbance.  Respiratory: Negative for shortness of breath.   Cardiovascular: Negative for leg swelling.  Gastrointestinal: Negative for nausea, abdominal pain and diarrhea.  Musculoskeletal: Negative for myalgias.  Skin: Negative for rash.  Neurological: Negative for dizziness, light-headedness and headaches.  Hematological: Negative for adenopathy.  Psychiatric/Behavioral: Negative for dysphoric mood.       Objective:   Physical Exam  Constitutional: He is oriented to person, place, and time. He appears well-developed and well-nourished. No distress.  HENT:  Mouth/Throat: No oropharyngeal exudate.  Eyes: Right eye exhibits no discharge. Left eye exhibits no discharge. No scleral icterus.  Cardiovascular: Normal rate, regular rhythm and normal heart sounds.   No murmur heard. Pulmonary/Chest: Effort normal and breath sounds normal. No respiratory distress. He has no wheezes.  Lymphadenopathy:    He has no cervical adenopathy.  Neurological: He is alert and oriented to person, place, and time.  Skin: Skin is warm and dry. No rash noted.          Assessment & Plan:

## 2013-12-04 NOTE — Assessment & Plan Note (Signed)
Doing better now.  Will call if any exacerbation

## 2014-01-16 ENCOUNTER — Ambulatory Visit: Payer: Self-pay

## 2014-03-06 ENCOUNTER — Encounter: Payer: Self-pay | Admitting: *Deleted

## 2014-04-02 ENCOUNTER — Other Ambulatory Visit: Payer: Self-pay

## 2014-04-02 ENCOUNTER — Ambulatory Visit: Payer: Self-pay

## 2014-04-03 ENCOUNTER — Other Ambulatory Visit: Payer: Self-pay

## 2014-04-09 ENCOUNTER — Other Ambulatory Visit (INDEPENDENT_AMBULATORY_CARE_PROVIDER_SITE_OTHER): Payer: Self-pay

## 2014-04-09 ENCOUNTER — Other Ambulatory Visit: Payer: Self-pay | Admitting: *Deleted

## 2014-04-09 DIAGNOSIS — Z79899 Other long term (current) drug therapy: Secondary | ICD-10-CM

## 2014-04-09 DIAGNOSIS — Z113 Encounter for screening for infections with a predominantly sexual mode of transmission: Secondary | ICD-10-CM

## 2014-04-09 DIAGNOSIS — B20 Human immunodeficiency virus [HIV] disease: Secondary | ICD-10-CM

## 2014-04-09 LAB — LIPID PANEL
CHOL/HDL RATIO: 3.2 ratio
Cholesterol: 167 mg/dL (ref 0–200)
HDL: 52 mg/dL (ref >39)
LDL Cholesterol: 103 mg/dL — ABNORMAL HIGH (ref 0–99)
Triglycerides: 61 mg/dL (ref <150)
VLDL: 12 mg/dL (ref 0–40)

## 2014-04-09 LAB — CBC WITH DIFFERENTIAL/PLATELET
Basophils Absolute: 0 10*3/uL (ref 0.0–0.1)
Basophils Relative: 0 % (ref 0–1)
Eosinophils Absolute: 0.2 10*3/uL (ref 0.0–0.7)
Eosinophils Relative: 4 % (ref 0–5)
HCT: 41.2 % (ref 39.0–52.0)
Hemoglobin: 14 g/dL (ref 13.0–17.0)
LYMPHS ABS: 2 10*3/uL (ref 0.7–4.0)
Lymphocytes Relative: 37 % (ref 12–46)
MCH: 30.9 pg (ref 26.0–34.0)
MCHC: 34 g/dL (ref 30.0–36.0)
MCV: 90.9 fL (ref 78.0–100.0)
Monocytes Absolute: 0.7 10*3/uL (ref 0.1–1.0)
Monocytes Relative: 12 % (ref 3–12)
NEUTROS ABS: 2.6 10*3/uL (ref 1.7–7.7)
Neutrophils Relative %: 47 % (ref 43–77)
Platelets: 253 10*3/uL (ref 150–400)
RBC: 4.53 MIL/uL (ref 4.22–5.81)
RDW: 13.4 % (ref 11.5–15.5)
WBC: 5.5 10*3/uL (ref 4.0–10.5)

## 2014-04-09 LAB — RPR

## 2014-04-09 LAB — COMPLETE METABOLIC PANEL WITH GFR
ALT: 9 U/L (ref 0–53)
AST: 15 U/L (ref 0–37)
Albumin: 4.2 g/dL (ref 3.5–5.2)
Alkaline Phosphatase: 58 U/L (ref 39–117)
BUN: 8 mg/dL (ref 6–23)
CALCIUM: 9.1 mg/dL (ref 8.4–10.5)
CHLORIDE: 106 meq/L (ref 96–112)
CO2: 26 mEq/L (ref 19–32)
CREATININE: 0.97 mg/dL (ref 0.50–1.35)
GFR, Est African American: >89 mL/min
Glucose, Bld: 73 mg/dL (ref 70–99)
Potassium: 4 mEq/L (ref 3.5–5.3)
Sodium: 139 mEq/L (ref 135–145)
Total Bilirubin: 0.9 mg/dL (ref 0.2–1.2)
Total Protein: 6.6 g/dL (ref 6.0–8.3)

## 2014-04-09 MED ORDER — ATAZANAVIR SULFATE 300 MG PO CAPS: 300.0000 mg | ORAL_CAPSULE | Freq: Every day | ORAL | Status: DC

## 2014-04-09 MED ORDER — EMTRICITABINE-TENOFOVIR DF 200-300 MG PO TABS: 1.0000 | ORAL_TABLET | Freq: Every day | ORAL | Status: DC

## 2014-04-09 MED ORDER — RITONAVIR 100 MG PO TABS: 100.0000 mg | ORAL_TABLET | Freq: Every day | ORAL | Status: DC

## 2014-04-10 LAB — URINE CYTOLOGY ANCILLARY ONLY
CHLAMYDIA, DNA PROBE: NEGATIVE
NEISSERIA GONORRHEA: NEGATIVE

## 2014-04-10 LAB — HIV-1 RNA QUANT-NO REFLEX-BLD
HIV 1 RNA Quant: <20 copies/mL (ref <20)
HIV-1 RNA Quant, Log: <1.3 {Log} (ref <1.30)

## 2014-04-10 LAB — T-HELPER CELL (CD4) - (RCID CLINIC ONLY)
CD4 T CELL HELPER: 21 % — AB (ref 33–55)
CD4 T Cell Abs: 440 /uL (ref 400–2700)

## 2014-04-16 ENCOUNTER — Encounter: Payer: Self-pay | Admitting: Internal Medicine

## 2014-04-16 ENCOUNTER — Telehealth: Payer: Self-pay | Admitting: *Deleted

## 2014-04-16 ENCOUNTER — Ambulatory Visit (INDEPENDENT_AMBULATORY_CARE_PROVIDER_SITE_OTHER): Payer: Self-pay | Admitting: Internal Medicine

## 2014-04-16 VITALS — BP 108/68 | HR 60 | Temp 97.6°F | Ht 68.0 in | Wt 170.0 lb

## 2014-04-16 DIAGNOSIS — B20 Human immunodeficiency virus [HIV] disease: Secondary | ICD-10-CM

## 2014-04-16 DIAGNOSIS — L259 Unspecified contact dermatitis, unspecified cause: Secondary | ICD-10-CM

## 2014-04-16 DIAGNOSIS — A630 Anogenital (venereal) warts: Secondary | ICD-10-CM

## 2014-04-16 MED ORDER — TRIAMCINOLONE ACETONIDE 0.5 % EX CREA: TOPICAL_CREAM | Freq: Two times a day (BID) | CUTANEOUS | Status: DC

## 2014-04-16 MED ORDER — ATAZANAVIR-COBICISTAT 300-150 MG PO TABS: 1.0000 | ORAL_TABLET | Freq: Every day | ORAL | Status: DC

## 2014-04-16 MED ORDER — EMTRICITABINE-TENOFOVIR DF 200-300 MG PO TABS: 1.0000 | ORAL_TABLET | Freq: Every day | ORAL | Status: DC

## 2014-04-16 NOTE — Assessment & Plan Note (Signed)
Possible wart.  I offered for him to be seen in clinic to evaluate for anal pap.  Pt deferred at this time.

## 2014-04-16 NOTE — Progress Notes (Signed)
  Subjective:    Patient ID: Nathan Maynard, male    DOB: November 09, 1991, 23 y.o.   MRN: [id number]  HPI  Comes in for routine followup. He continues on Reyataz, Norvir and Truvada. He does tell me he misses occasional doses. He feels well and is pleased with his regimen. No complaint of depression.  Eczema acting up.  Concerned with perianal wart.       Review of Systems  Constitutional: Negative for fever, fatigue and unexpected weight change.  HENT: Negative for sore throat and trouble swallowing.   Eyes: Negative for visual disturbance.  Respiratory: Negative for shortness of breath.   Cardiovascular: Negative for leg swelling.  Gastrointestinal: Negative for nausea, abdominal pain and diarrhea.  Musculoskeletal: Negative for myalgias.  Skin: Negative for rash.  Neurological: Negative for dizziness, light-headedness and headaches.  Hematological: Negative for adenopathy.  Psychiatric/Behavioral: Negative for dysphoric mood.       Objective:   Physical Exam  Constitutional: He is oriented to person, place, and time. He appears well-developed and well-nourished. No distress.  HENT:  Mouth/Throat: No oropharyngeal exudate.  Eyes: Right eye exhibits no discharge. Left eye exhibits no discharge. No scleral icterus.  Cardiovascular: Normal rate, regular rhythm and normal heart sounds.   No murmur heard. Pulmonary/Chest: Effort normal and breath sounds normal. No respiratory distress. He has no wheezes.  Genitourinary:  + 1-2 mm growth left perianal region  Lymphadenopathy:    He has no cervical adenopathy.  Neurological: He is alert and oriented to person, place, and time.  Skin: Skin is warm and dry. No rash noted.          Assessment & Plan:

## 2014-04-16 NOTE — Assessment & Plan Note (Signed)
Will resend Kenalog

## 2014-04-16 NOTE — Telephone Encounter (Signed)
Received request for creatinine clearance from [facility] r/t the Evotaz prescription.  Asked Ulyses SouthwardMinh Pham, pharmacist for advice.  He calculated the CrCl to be 120 ml/min.  Rn phoned the result to [facility]. Andree CossMichelle M Shanay Woolman, RN

## 2014-04-16 NOTE — Assessment & Plan Note (Signed)
Doing well with current regimen.  Will change to Evotaz with Truvada.  RTC 4 months. Discussed compliance and need to take daily.

## 2014-08-15 ENCOUNTER — Other Ambulatory Visit: Payer: Self-pay

## 2014-08-27 ENCOUNTER — Ambulatory Visit: Payer: Self-pay | Admitting: Internal Medicine

## 2014-08-29 ENCOUNTER — Ambulatory Visit: Payer: Self-pay | Admitting: Internal Medicine

## 2014-09-10 ENCOUNTER — Ambulatory Visit: Payer: Self-pay

## 2014-09-10 ENCOUNTER — Other Ambulatory Visit (INDEPENDENT_AMBULATORY_CARE_PROVIDER_SITE_OTHER): Payer: Self-pay

## 2014-09-10 DIAGNOSIS — B20 Human immunodeficiency virus [HIV] disease: Secondary | ICD-10-CM

## 2014-09-10 LAB — BASIC METABOLIC PANEL WITH GFR
BUN: 9 mg/dL (ref 6–23)
CO2: 28 mEq/L (ref 19–32)
Calcium: 9.3 mg/dL (ref 8.4–10.5)
Chloride: 104 mEq/L (ref 96–112)
Creat: 1.22 mg/dL (ref 0.50–1.35)
GFR, EST NON AFRICAN AMERICAN: 82 mL/min
GLUCOSE: 91 mg/dL (ref 70–99)
POTASSIUM: 4.2 meq/L (ref 3.5–5.3)
SODIUM: 139 meq/L (ref 135–145)

## 2014-09-11 LAB — T-HELPER CELL (CD4) - (RCID CLINIC ONLY)
CD4 T CELL ABS: 460 /uL (ref 400–2700)
CD4 T CELL HELPER: 22 % — AB (ref 33–55)

## 2014-09-12 LAB — HIV-1 RNA QUANT-NO REFLEX-BLD

## 2014-10-01 ENCOUNTER — Ambulatory Visit: Payer: Self-pay | Admitting: Internal Medicine

## 2015-01-08 ENCOUNTER — Ambulatory Visit: Payer: Self-pay

## 2015-01-21 ENCOUNTER — Ambulatory Visit: Payer: Self-pay

## 2015-01-23 ENCOUNTER — Other Ambulatory Visit (INDEPENDENT_AMBULATORY_CARE_PROVIDER_SITE_OTHER): Payer: Self-pay

## 2015-01-23 DIAGNOSIS — B20 Human immunodeficiency virus [HIV] disease: Secondary | ICD-10-CM

## 2015-01-24 ENCOUNTER — Other Ambulatory Visit: Payer: Self-pay | Admitting: *Deleted

## 2015-01-24 DIAGNOSIS — B20 Human immunodeficiency virus [HIV] disease: Secondary | ICD-10-CM

## 2015-01-24 LAB — CBC WITH DIFFERENTIAL/PLATELET
Basophils Absolute: 0 10*3/uL (ref 0.0–0.1)
Basophils Relative: 0 % (ref 0–1)
EOS ABS: 0.1 10*3/uL (ref 0.0–0.7)
EOS PCT: 2 % (ref 0–5)
HCT: 42 % (ref 39.0–52.0)
HEMOGLOBIN: 14.4 g/dL (ref 13.0–17.0)
Lymphocytes Relative: 40 % (ref 12–46)
Lymphs Abs: 1.8 10*3/uL (ref 0.7–4.0)
MCH: 31.2 pg (ref 26.0–34.0)
MCHC: 34.3 g/dL (ref 30.0–36.0)
MCV: 90.9 fL (ref 78.0–100.0)
MONO ABS: 0.7 10*3/uL (ref 0.1–1.0)
MONOS PCT: 15 % — AB (ref 3–12)
MPV: 9.5 fL (ref 8.6–12.4)
NEUTROS ABS: 2 10*3/uL (ref 1.7–7.7)
NEUTROS PCT: 43 % (ref 43–77)
Platelets: 240 10*3/uL (ref 150–400)
RBC: 4.62 MIL/uL (ref 4.22–5.81)
RDW: 13.4 % (ref 11.5–15.5)
WBC: 4.6 10*3/uL (ref 4.0–10.5)

## 2015-01-24 LAB — COMPREHENSIVE METABOLIC PANEL
ALBUMIN: 4.1 g/dL (ref 3.5–5.2)
ALT: 18 U/L (ref 0–53)
AST: 20 U/L (ref 0–37)
Alkaline Phosphatase: 66 U/L (ref 39–117)
BILIRUBIN TOTAL: 1.3 mg/dL — AB (ref 0.2–1.2)
BUN: 8 mg/dL (ref 6–23)
CALCIUM: 8.8 mg/dL (ref 8.4–10.5)
CHLORIDE: 106 meq/L (ref 96–112)
CO2: 25 meq/L (ref 19–32)
Creat: 0.99 mg/dL (ref 0.50–1.35)
GLUCOSE: 85 mg/dL (ref 70–99)
POTASSIUM: 3.9 meq/L (ref 3.5–5.3)
Sodium: 140 mEq/L (ref 135–145)
TOTAL PROTEIN: 6.9 g/dL (ref 6.0–8.3)

## 2015-01-24 LAB — T-HELPER CELL (CD4) - (RCID CLINIC ONLY)
CD4 % Helper T Cell: 23 % — ABNORMAL LOW (ref 33–55)
CD4 T Cell Abs: 390 /uL — ABNORMAL LOW (ref 400–2700)

## 2015-01-24 MED ORDER — EMTRICITABINE-TENOFOVIR DF 200-300 MG PO TABS: 1.0000 | ORAL_TABLET | Freq: Every day | ORAL | Status: DC

## 2015-01-24 MED ORDER — ATAZANAVIR-COBICISTAT 300-150 MG PO TABS: 1.0000 | ORAL_TABLET | Freq: Every day | ORAL | Status: DC

## 2015-01-24 NOTE — Telephone Encounter (Signed)
ADAP Application 

## 2015-01-25 LAB — HIV-1 RNA QUANT-NO REFLEX-BLD
HIV 1 RNA Quant: 14062 copies/mL — ABNORMAL HIGH (ref <20)
HIV-1 RNA Quant, Log: 4.15 {Log} — ABNORMAL HIGH (ref <1.30)

## 2015-01-28 ENCOUNTER — Ambulatory Visit: Payer: Self-pay | Admitting: Internal Medicine

## 2015-03-28 ENCOUNTER — Telehealth: Payer: Self-pay | Admitting: Licensed Clinical Social Worker

## 2015-03-28 NOTE — Telephone Encounter (Signed)
[facility] pharmacy called stating that patients ADAP was approved and can't reach the patient by phone. Verified phone number were the same, they have not shipped to the patient since August. Patient need to come in for a visit before filling medications.

## 2015-07-16 ENCOUNTER — Telehealth: Payer: Self-pay | Admitting: *Deleted

## 2015-07-16 NOTE — Telephone Encounter (Signed)
Unable to reach patient to notify him of RW/ADAP recertification.  His phone is not accepting incoming calls at this time.  Patient has not been seen in 15 months.  Will send to [facility]. Andree Coss, RN

## 2016-05-19 ENCOUNTER — Ambulatory Visit: Payer: Self-pay

## 2016-05-24 ENCOUNTER — Telehealth: Payer: Self-pay | Admitting: *Deleted

## 2016-05-24 ENCOUNTER — Other Ambulatory Visit: Payer: Self-pay | Admitting: Internal Medicine

## 2016-05-24 DIAGNOSIS — B20 Human immunodeficiency virus [HIV] disease: Secondary | ICD-10-CM

## 2016-05-24 NOTE — Telephone Encounter (Signed)
Notes received, placed in Tammy King's office for transfer patients. Scheduled a lab, ADAP, and MD appt. Bradly BienenstockKathy Harrelson, financial assistant has already received what she needs to process his Juanell Fairlyyan White and ADAP; he will meet briefly with her tomorrow. Nathan MolaJacqueline Camy Leder

## 2016-05-25 ENCOUNTER — Ambulatory Visit: Payer: Self-pay

## 2016-05-25 ENCOUNTER — Other Ambulatory Visit (INDEPENDENT_AMBULATORY_CARE_PROVIDER_SITE_OTHER): Payer: Self-pay

## 2016-05-25 DIAGNOSIS — Z79899 Other long term (current) drug therapy: Secondary | ICD-10-CM

## 2016-05-25 DIAGNOSIS — Z113 Encounter for screening for infections with a predominantly sexual mode of transmission: Secondary | ICD-10-CM

## 2016-05-25 DIAGNOSIS — B20 Human immunodeficiency virus [HIV] disease: Secondary | ICD-10-CM

## 2016-05-25 LAB — CBC WITH DIFFERENTIAL/PLATELET
BASOS PCT: 0 %
Basophils Absolute: 0 cells/uL (ref 0–200)
EOS ABS: 186 {cells}/uL (ref 15–500)
Eosinophils Relative: 3 %
HEMATOCRIT: 42.6 % (ref 38.5–50.0)
Hemoglobin: 14.6 g/dL (ref 13.2–17.1)
LYMPHS ABS: 2046 {cells}/uL (ref 850–3900)
Lymphocytes Relative: 33 %
MCH: 31.9 pg (ref 27.0–33.0)
MCHC: 34.3 g/dL (ref 32.0–36.0)
MCV: 93.2 fL (ref 80.0–100.0)
MONO ABS: 558 {cells}/uL (ref 200–950)
MPV: 9.5 fL (ref 7.5–12.5)
Monocytes Relative: 9 %
NEUTROS ABS: 3410 {cells}/uL (ref 1500–7800)
Neutrophils Relative %: 55 %
Platelets: 244 10*3/uL (ref 140–400)
RBC: 4.57 MIL/uL (ref 4.20–5.80)
RDW: 13.3 % (ref 11.0–15.0)
WBC: 6.2 10*3/uL (ref 3.8–10.8)

## 2016-05-25 LAB — COMPREHENSIVE METABOLIC PANEL
ALT: 9 U/L (ref 9–46)
AST: 13 U/L (ref 10–40)
Albumin: 4.2 g/dL (ref 3.6–5.1)
Alkaline Phosphatase: 55 U/L (ref 40–115)
BUN: 9 mg/dL (ref 7–25)
CHLORIDE: 106 mmol/L (ref 98–110)
CO2: 26 mmol/L (ref 20–31)
CREATININE: 1.14 mg/dL (ref 0.60–1.35)
Calcium: 9.2 mg/dL (ref 8.6–10.3)
Glucose, Bld: 100 mg/dL — ABNORMAL HIGH (ref 65–99)
POTASSIUM: 4 mmol/L (ref 3.5–5.3)
Sodium: 139 mmol/L (ref 135–146)
TOTAL PROTEIN: 6.8 g/dL (ref 6.1–8.1)
Total Bilirubin: 0.8 mg/dL (ref 0.2–1.2)

## 2016-05-25 LAB — LIPID PANEL
CHOLESTEROL: 185 mg/dL (ref 125–200)
HDL: 53 mg/dL (ref >=40)
LDL Cholesterol: 120 mg/dL (ref <130)
TRIGLYCERIDES: 59 mg/dL (ref <150)
Total CHOL/HDL Ratio: 3.5 Ratio (ref <=5.0)
VLDL: 12 mg/dL (ref <30)

## 2016-05-26 LAB — URINE CYTOLOGY ANCILLARY ONLY
CHLAMYDIA, DNA PROBE: NEGATIVE
NEISSERIA GONORRHEA: NEGATIVE
TRICH (WINDOWPATH): NEGATIVE

## 2016-05-26 LAB — HIV-1 RNA QUANT-NO REFLEX-BLD: HIV 1 RNA Quant: <20 copies/mL (ref <20)

## 2016-05-26 LAB — T-HELPER CELL (CD4) - (RCID CLINIC ONLY)
CD4 % Helper T Cell: 18 % — ABNORMAL LOW (ref 33–55)
CD4 T Cell Abs: 370 /uL — ABNORMAL LOW (ref 400–2700)

## 2016-05-26 LAB — RPR

## 2016-06-01 ENCOUNTER — Encounter: Payer: Self-pay | Admitting: Internal Medicine

## 2016-06-24 ENCOUNTER — Ambulatory Visit: Payer: Self-pay | Admitting: Internal Medicine

## 2016-06-24 ENCOUNTER — Encounter: Payer: Self-pay | Admitting: Internal Medicine

## 2016-06-24 ENCOUNTER — Ambulatory Visit (INDEPENDENT_AMBULATORY_CARE_PROVIDER_SITE_OTHER): Payer: Self-pay | Admitting: Internal Medicine

## 2016-06-24 VITALS — BP 114/80 | HR 70 | Temp 98.4°F | Ht 67.5 in | Wt 156.0 lb

## 2016-06-24 DIAGNOSIS — K0889 Other specified disorders of teeth and supporting structures: Secondary | ICD-10-CM

## 2016-06-24 DIAGNOSIS — B20 Human immunodeficiency virus [HIV] disease: Secondary | ICD-10-CM

## 2016-06-24 MED ORDER — ELVITEG-COBIC-EMTRICIT-TENOFAF 150-150-200-10 MG PO TABS: 1.0000 | ORAL_TABLET | Freq: Every day | ORAL | Status: DC

## 2016-06-24 NOTE — Progress Notes (Signed)
CC: Follow up for HIV  Interval history: He returns to care here after moving back from [place].  He previously was on a PI based regimen and [facility] [place] and [place] being [place] [facility].  His labs here confirm this.  Currently is asymptomatic and well-controlled on Genvoya.  Denies any missed doses.  Pleased with medicine.  Sexually active and always uses condoms. No penile discharge, no warts front or back.  No weight loss, no diarrhea.   Prior to Admission medications   Medication Sig Start Date End Date Taking? Authorizing Provider  elvitegravir-cobicistat-emtricitabine-tenofovir (GENVOYA) 150-150-200-10 MG TABS tablet Take 1 tablet by mouth daily with breakfast. 06/24/16  Yes Gardiner Barefootobert W Stavros Cail, MD  triamcinolone cream (KENALOG) 0.5 % Apply topically 2 (two) times daily. As needed for eczema 04/16/14  Yes Gardiner Barefootobert W Lurline Caver, MD    Review of Systems Constitutional: negative for fatigue and malaise Musculoskeletal: negative for myalgias and arthralgias All other systems reviewed and are negative   Physical Exam: CONSTITUTIONAL:in no apparent distress and alert  Filed Vitals:   06/24/16 0957  BP: 114/80  Pulse: 70  Temp: 98.4 F (36.9 C)   Eyes: anicteric HENT: no thrush, no cervical lymphadenopathy Respiratory: Normal respiratory effort; CTA B  Lab Results  Component Value Date   HIV1RNAQUANT <20 05/25/2016   HIV1RNAQUANT 14062* 01/23/2015   HIV1RNAQUANT <20 09/10/2014   No components found for: HIV1GENOTYPRPLUS No components found for: THELPERCELL   Social History   Social History  . Marital Status: Single    Spouse Name: N/A  . Number of Children: N/A  . Years of Education: N/A   Occupational History  . Not on file.   Social History Main Topics  . Smoking status: Current Some Day Smoker -- 0.30 packs/day for 6 years    Types: Cigarettes  . Smokeless tobacco: Never Used  . Alcohol Use: 0.0 oz/week    0 Standard drinks or equivalent per week     Comment:  occasional  . Drug Use: No  . Sexual Activity:    Partners: Male     Comment: refused condoms   Other Topics Concern  . Not on file   Social History Narrative

## 2016-06-24 NOTE — Assessment & Plan Note (Signed)
Will get him into our dental clinic.

## 2016-06-24 NOTE — Assessment & Plan Note (Signed)
Doing great.  rtc 4 months and then can do every 6 months.

## 2016-09-08 ENCOUNTER — Ambulatory Visit: Payer: Self-pay

## 2016-09-10 ENCOUNTER — Encounter: Payer: Self-pay | Admitting: Internal Medicine

## 2016-10-04 ENCOUNTER — Other Ambulatory Visit (INDEPENDENT_AMBULATORY_CARE_PROVIDER_SITE_OTHER): Payer: Self-pay

## 2016-10-04 DIAGNOSIS — B20 Human immunodeficiency virus [HIV] disease: Secondary | ICD-10-CM

## 2016-10-05 LAB — T-HELPER CELL (CD4) - (RCID CLINIC ONLY)
CD4 T CELL ABS: 400 /uL (ref 400–2700)
CD4 T CELL HELPER: 17 % — AB (ref 33–55)

## 2016-10-06 LAB — HIV-1 RNA QUANT-NO REFLEX-BLD
HIV 1 RNA QUANT: 47 {copies}/mL — AB (ref <20)
HIV-1 RNA QUANT, LOG: 1.67 {Log_copies}/mL — AB (ref <1.30)

## 2016-10-28 ENCOUNTER — Ambulatory Visit (INDEPENDENT_AMBULATORY_CARE_PROVIDER_SITE_OTHER): Payer: Self-pay | Admitting: Internal Medicine

## 2016-10-28 VITALS — BP 103/63 | HR 73 | Temp 98.3°F | Ht 67.0 in | Wt 163.0 lb

## 2016-10-28 DIAGNOSIS — Z23 Encounter for immunization: Secondary | ICD-10-CM

## 2016-10-28 DIAGNOSIS — Z79899 Other long term (current) drug therapy: Secondary | ICD-10-CM

## 2016-10-28 DIAGNOSIS — Z113 Encounter for screening for infections with a predominantly sexual mode of transmission: Secondary | ICD-10-CM

## 2016-10-28 DIAGNOSIS — Z21 Asymptomatic human immunodeficiency virus [HIV] infection status: Secondary | ICD-10-CM

## 2016-10-28 DIAGNOSIS — F320 Major depressive disorder, single episode, mild: Secondary | ICD-10-CM

## 2016-10-28 DIAGNOSIS — B20 Human immunodeficiency virus [HIV] disease: Secondary | ICD-10-CM

## 2016-10-28 NOTE — Assessment & Plan Note (Signed)
Doing well.  Can rtc 6 months.  

## 2016-10-28 NOTE — Assessment & Plan Note (Signed)
No current complaints now.

## 2016-10-28 NOTE — Progress Notes (Signed)
CC: Follow up for HIV  Interval history: He returns to care here for his second visit after moving back from [place].  He previously was on a PI based regimen and [facility] [place] and reports being undetectable there.  Now back on Genvoya and doing well.  Denies any missed doses.  Pleased with medicine.  Sexually active and always uses condoms. No penile discharge, no warts front or back.  No weight loss, no diarrhea.   Prior to Admission medications   Medication Sig Start Date End Date Taking? Authorizing Provider  elvitegravir-cobicistat-emtricitabine-tenofovir (GENVOYA) 150-150-200-10 MG TABS tablet Take 1 tablet by mouth daily with breakfast. 06/24/16  Yes Gardiner Barefootobert W Quill Grinder, MD  triamcinolone cream (KENALOG) 0.5 % Apply topically 2 (two) times daily. As needed for eczema 04/16/14  Yes Gardiner Barefootobert W Grethel Zenk, MD    Review of Systems Constitutional: negative for fatigue and malaise Musculoskeletal: negative for myalgias and arthralgias All other systems reviewed and are negative   Physical Exam: CONSTITUTIONAL:in no apparent distress and alert  Vitals:   10/28/16 1105  BP: 103/63  Pulse: 73  Temp: 98.3 F (36.8 C)   Eyes: anicteric HENT: no thrush, no cervical lymphadenopathy Respiratory: Normal respiratory effort; CTA B  Lab Results  Component Value Date   HIV1RNAQUANT 47 (H) 10/04/2016   HIV1RNAQUANT <20 05/25/2016   HIV1RNAQUANT 14,062 (H) 01/23/2015   No components found for: HIV1GENOTYPRPLUS No components found for: THELPERCELL   Social History   Social History  . Marital status: Single    Spouse name: N/A  . Number of children: N/A  . Years of education: N/A   Occupational History  . Not on file.   Social History Main Topics  . Smoking status: Current Some Day Smoker    Packs/day: 0.30    Years: 6.00    Types: Cigarettes  . Smokeless tobacco: Never Used  . Alcohol use 0.0 oz/week     Comment: occasional  . Drug use: No  . Sexual activity: Yes    Partners: Male      Comment: refused condoms   Other Topics Concern  . Not on file   Social History Narrative  . No narrative on file

## 2017-01-19 ENCOUNTER — Telehealth: Payer: Self-pay | Admitting: *Deleted

## 2017-01-19 NOTE — Telephone Encounter (Signed)
Patient called c/o left arm pain for about a month. He's not sure if it is the way he is sleeping. Advised patient he needed to establish with a PCP for this issue and I gave [facility] the [facility] to [facility]. He agreed to do this.

## 2017-03-10 ENCOUNTER — Encounter (HOSPITAL_COMMUNITY): Payer: Self-pay

## 2017-03-10 ENCOUNTER — Emergency Department (HOSPITAL_COMMUNITY)
Admission: EM | Admit: 2017-03-10 | Discharge: 2017-03-10 | Disposition: A | Discharge status: Home / Self Care | Payer: Self-pay | Attending: Emergency Medicine | Admitting: Emergency Medicine

## 2017-03-10 ENCOUNTER — Emergency Department (HOSPITAL_COMMUNITY): Payer: Self-pay

## 2017-03-10 DIAGNOSIS — M79641 Pain in right hand: Secondary | ICD-10-CM | POA: Insufficient documentation

## 2017-03-10 DIAGNOSIS — Y929 Unspecified place or not applicable: Secondary | ICD-10-CM | POA: Insufficient documentation

## 2017-03-10 DIAGNOSIS — M79642 Pain in left hand: Secondary | ICD-10-CM

## 2017-03-10 DIAGNOSIS — Y939 Activity, unspecified: Secondary | ICD-10-CM | POA: Insufficient documentation

## 2017-03-10 DIAGNOSIS — Y990 Civilian activity done for income or pay: Secondary | ICD-10-CM | POA: Insufficient documentation

## 2017-03-10 DIAGNOSIS — X500XXA Overexertion from strenuous movement or load, initial encounter: Secondary | ICD-10-CM | POA: Insufficient documentation

## 2017-03-10 DIAGNOSIS — F17210 Nicotine dependence, cigarettes, uncomplicated: Secondary | ICD-10-CM | POA: Insufficient documentation

## 2017-03-10 MED ORDER — KETOROLAC TROMETHAMINE 30 MG/ML IJ SOLN
30.0000 mg | Freq: Once | INTRAMUSCULAR | Status: AC
  Administered 2017-03-10: 30 mg via INTRAMUSCULAR
  Filled 2017-03-10: qty 1

## 2017-03-10 NOTE — Discharge Instructions (Signed)
Patient urostomy, icing, elevating the right hand. When the splint for comfort. Motrin and Tylenol for pain. Do not take any extra Advil, Motrin, Aleve, naproxen today since I gave him Toradol in the ED. May take Tylenol. Soaking in warm water with Epsom salt. Follow-up with orthopedist if symptoms are not improving. Return to ED if he develop any redness, swelling, fevers, worsening pain.

## 2017-03-10 NOTE — ED Triage Notes (Signed)
Per Pt, Pt is coming from home with complaints of right hand pain that started on Sunday. Unknown if there is an injury. No swelling or erythema noted.

## 2017-03-10 NOTE — ED Provider Notes (Signed)
[facility]-EMERGENCY DEPT Provider Note   CSN: [id number] Arrival date & time: 03/10/17  5366  By signing my name below, I, Teofilo Pod, attest that this documentation has been prepared under the direction and in the presence of Azucena Kuba, PA-C. Electronically Signed: Teofilo Pod, ED Scribe. 03/10/2017. 10:06 AM.    History   Chief Complaint Chief Complaint  Patient presents with  . Hand Pain    The history is provided by the patient. No language interpreter was used.   HPI Comments:  Nathan Maynard is a 26 y.o. male with PMHx of HIV who presents to the Emergency Department complaining of constant right hand pain x 4 days. He reports that the pain is primarily in the center of his palm and up his middle finger. He notes that he may have injuring his right index and middle fingers while doing heavy lifting at work. 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 Pt started wearing a brace last night at work which he state made the pain worse. Pt took naproxen, percocet, and used epsom salts with no relief. Denies Weakness, erythema, edema, numbness, open wound.   Past Medical History:  Diagnosis Date  . HIV infection [facility])     Patient Active Problem List   Diagnosis Date Noted  . Encounter for long-term (current) use of high-risk medication 10/28/2016  . Screening examination for venereal disease 10/28/2016  . Pain, dental 06/24/2016  . Tooth gap 12/04/2013  . Depression 11/28/2012  . 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 Human immunodeficiency virus I infection (HCC) 04/07/2009  . ALLERGIC RHINITIS 04/07/2009  . ECZEMA 04/07/2009    History reviewed. No pertinent surgical history.     Home Medications    Prior to Admission medications   Medication Sig Start Date End Date Taking? Authorizing Provider  elvitegravir-cobicistat-emtricitabine-tenofovir (GENVOYA) 150-150-200-10 MG TABS tablet Take 1 tablet by mouth daily with breakfast.  06/24/16   Gardiner Barefoot, MD  triamcinolone cream (KENALOG) 0.5 % Apply topically 2 (two) times daily. As needed for eczema 04/16/14   Gardiner Barefoot, MD    Family History No family history on file.  Social History Social History  Substance Use Topics  . Smoking status: Current Some Day Smoker    Packs/day: 0.30    Years: 6.00    Types: Cigarettes  . Smokeless tobacco: Never Used  . Alcohol use 0.0 oz/week     Comment: occasional     Allergies   Patient has no known allergies.   Review of Systems Review of Systems  Musculoskeletal: Positive for arthralgias.  Neurological: Negative for numbness.     Physical Exam Updated Vital Signs BP 120/78 (BP Location: Left Arm)   Pulse 64   Temp 97.6 F (36.4 C) (Oral)   Resp 18   Ht 5\' 7"  (1.702 m)   Wt 155 lb (70.3 kg)   SpO2 100%   BMI 24.28 kg/m   Physical Exam  Constitutional: He appears well-developed and well-nourished. No distress.  HENT:  Head: Normocephalic and atraumatic.  Eyes: Conjunctivae are normal.  Cardiovascular: Normal rate.   Pulmonary/Chest: Effort normal.  Abdominal: He exhibits no distension.  Musculoskeletal:  Right hand: Mild tenderness to palmar region of right hand. No erythema, edema or open wound. Strength 5/5. Good adduction and abduction of thumb. Normal grip strength. Radial pulses 2+. Capillary refill normal. Sensation intact to sharp/dull. Full ROM of right wrist without pain. There is no tenderness to  flexion or extension of the fingers. Hand is held in a neutral position.  Neurological: He is alert.  Skin: Skin is warm and dry.  Psychiatric: He has a normal mood and affect.  Nursing note and vitals reviewed.    ED Treatments / Results  DIAGNOSTIC STUDIES:  Oxygen Saturation is 100% on RA, normal by my interpretation.    COORDINATION OF CARE:  10:05 AM Will provide Toradol and brace. Discussed treatment plan with pt at bedside and pt agreed to plan.   Labs (all labs ordered  are listed, but only abnormal results are displayed) Labs Reviewed - No data to display  EKG  EKG Interpretation None       Radiology Dg Hand Complete Right  Result Date: 03/10/2017 CLINICAL DATA:  Nonspecific hand injury. EXAM: RIGHT HAND - COMPLETE 3+ VIEW COMPARISON:  No recent prior . FINDINGS: No acute bony or joint abnormality identified. No evidence of fracture or dislocation. IMPRESSION: No acute abnormality. Electronically Signed   By: Maisie Fushomas  Register   On: 03/10/2017 09:47    Procedures Procedures (including critical care time)  Medications Ordered in ED Medications  ketorolac (TORADOL) 30 MG/ML injection 30 mg (30 mg Intramuscular Given 03/10/17 1016)     Initial Impression / Assessment and Plan / ED Course  I have reviewed the triage vital signs and the nursing notes.  Pertinent labs & imaging results that were available during my care of the patient were reviewed by me and considered in my medical decision making (see chart for details).     Patient resents to the ED with complaints of right hand pain after heavy lifting at work. Patient has full range of motion. He is neurovascularly intact. Strength is normal. No signs of erythema or edema. No kavanel sign concerning for flexor tenosynovitis. No signs of cellulitis or abscess. Tender to palpation of the palmar region. Patient be given Toradol in the ED. We placed in a wrist splint. Have encouraged to follow up with orthopedics. X-ray of hand shows no acute abnormalities. Likely MSK injury due to heavy lifting at work. He is afebrile and not tachycardic. Vital signs are stable. Encourage rice therapy and symptomatically treatment at home. Patient verbalized understanding the plan of care. All questions were answered prior to discharge.  Final Clinical Impressions(s) / ED Diagnoses   Final diagnoses:  Left hand pain    New Prescriptions New Prescriptions   No medications on file  I personally performed the  services described in this documentation, which was scribed in my presence. The recorded information has been reviewed and is accurate.     Rise MuKenneth T Breyer Tejera, PA-C 03/10/17 1027    Bethann BerkshireJoseph Zammit, MD 03/10/17 [phone]

## 2017-03-10 NOTE — Progress Notes (Signed)
Orthopedic Tech Progress Note Patient Details:  Nathan Maynard 1990-12-27 [id number]  Ortho Devices Type of Ortho Device: Thumb velcro splint Ortho Device/Splint Interventions: Application   [name] 03/10/2017, 10:21 AM

## 2017-03-24 ENCOUNTER — Ambulatory Visit: Payer: Self-pay

## 2017-04-01 ENCOUNTER — Encounter: Payer: Self-pay | Admitting: Internal Medicine

## 2017-04-07 ENCOUNTER — Other Ambulatory Visit (INDEPENDENT_AMBULATORY_CARE_PROVIDER_SITE_OTHER): Payer: Self-pay

## 2017-04-07 DIAGNOSIS — B20 Human immunodeficiency virus [HIV] disease: Secondary | ICD-10-CM

## 2017-04-07 DIAGNOSIS — Z79899 Other long term (current) drug therapy: Secondary | ICD-10-CM

## 2017-04-07 DIAGNOSIS — Z113 Encounter for screening for infections with a predominantly sexual mode of transmission: Secondary | ICD-10-CM

## 2017-04-07 LAB — CBC WITH DIFFERENTIAL/PLATELET
Basophils Absolute: 0 cells/uL (ref 0–200)
Basophils Relative: 0 %
EOS PCT: 2 %
Eosinophils Absolute: 110 cells/uL (ref 15–500)
HCT: 43 % (ref 38.5–50.0)
Hemoglobin: 14 g/dL (ref 13.2–17.1)
LYMPHS PCT: 42 %
Lymphs Abs: 2310 cells/uL (ref 850–3900)
MCH: 31 pg (ref 27.0–33.0)
MCHC: 32.6 g/dL (ref 32.0–36.0)
MCV: 95.3 fL (ref 80.0–100.0)
MPV: 9.1 fL (ref 7.5–12.5)
Monocytes Absolute: 605 cells/uL (ref 200–950)
Monocytes Relative: 11 %
NEUTROS PCT: 45 %
Neutro Abs: 2475 cells/uL (ref 1500–7800)
Platelets: 269 10*3/uL (ref 140–400)
RBC: 4.51 MIL/uL (ref 4.20–5.80)
RDW: 13.9 % (ref 11.0–15.0)
WBC: 5.5 10*3/uL (ref 3.8–10.8)

## 2017-04-07 LAB — COMPLETE METABOLIC PANEL WITH GFR
AG Ratio: 1.6 Ratio (ref 1.0–2.5)
ALK PHOS: 53 U/L (ref 40–115)
ALT: 9 U/L (ref 9–46)
AST: 15 U/L (ref 10–40)
Albumin: 4.1 g/dL (ref 3.6–5.1)
BUN / CREAT RATIO: 8.5 ratio (ref 6–22)
BUN: 10 mg/dL (ref 7–25)
CO2: 20 mmol/L (ref 20–31)
CREATININE: 1.17 mg/dL (ref 0.60–1.35)
Calcium: 9.1 mg/dL (ref 8.6–10.3)
Chloride: 107 mmol/L (ref 98–110)
GFR, Est African American: >89 mL/min (ref >=60)
GFR, Est Non African American: 86 mL/min (ref >=60)
Globulin: 2.5 g/dL (ref 1.9–3.7)
Glucose, Bld: 83 mg/dL (ref 65–99)
Potassium: 4.1 mmol/L (ref 3.5–5.3)
SODIUM: 142 mmol/L (ref 135–146)
Total Bilirubin: 0.6 mg/dL (ref 0.2–1.2)
Total Protein: 6.6 g/dL (ref 6.1–8.1)

## 2017-04-07 LAB — LIPID PANEL
CHOL/HDL RATIO: 3.5 ratio (ref <5.0)
Cholesterol: 197 mg/dL (ref <200)
HDL: 57 mg/dL (ref >40)
LDL Cholesterol: 128 mg/dL — ABNORMAL HIGH (ref <100)
TRIGLYCERIDES: 62 mg/dL (ref <150)
VLDL: 12 mg/dL (ref <30)

## 2017-04-08 LAB — URINE CYTOLOGY ANCILLARY ONLY
CHLAMYDIA, DNA PROBE: NEGATIVE
NEISSERIA GONORRHEA: NEGATIVE

## 2017-04-08 LAB — T-HELPER CELL (CD4) - (RCID CLINIC ONLY)
CD4 % Helper T Cell: 16 % — ABNORMAL LOW (ref 33–55)
CD4 T Cell Abs: 430 /uL (ref 400–2700)

## 2017-04-08 LAB — RPR

## 2017-04-11 ENCOUNTER — Other Ambulatory Visit: Payer: Self-pay

## 2017-04-11 LAB — HIV-1 RNA QUANT-NO REFLEX-BLD
HIV 1 RNA Quant: <20 copies/mL — AB
HIV-1 RNA Quant, Log: <1.3 Log copies/mL — AB

## 2017-04-25 ENCOUNTER — Ambulatory Visit: Payer: Self-pay | Admitting: Internal Medicine

## 2017-04-28 ENCOUNTER — Encounter: Payer: Self-pay | Admitting: Internal Medicine

## 2017-04-28 ENCOUNTER — Ambulatory Visit (INDEPENDENT_AMBULATORY_CARE_PROVIDER_SITE_OTHER): Payer: Self-pay | Admitting: Internal Medicine

## 2017-04-28 VITALS — BP 116/78 | HR 79 | Temp 98.3°F | Wt 165.0 lb

## 2017-04-28 DIAGNOSIS — Z23 Encounter for immunization: Secondary | ICD-10-CM

## 2017-04-28 DIAGNOSIS — Z720 Tobacco use: Secondary | ICD-10-CM | POA: Insufficient documentation

## 2017-04-28 DIAGNOSIS — Z79899 Other long term (current) drug therapy: Secondary | ICD-10-CM

## 2017-04-28 DIAGNOSIS — Z113 Encounter for screening for infections with a predominantly sexual mode of transmission: Secondary | ICD-10-CM

## 2017-04-28 DIAGNOSIS — B20 Human immunodeficiency virus [HIV] disease: Secondary | ICD-10-CM

## 2017-04-28 NOTE — Progress Notes (Signed)
   Subjective:    Patient ID: Nathan Maynard, male    DOB: 1991/11/04, 26 y.o.   MRN: [id number]  HPI Here for follow up of HIV.  Has been on Genvoya and denies any missed doses.  Feels well and no new issues.  No associated n/v/d.  Sexually active with condoms.  CD4 430, viral load < 20.     Review of Systems  Constitutional: Negative for fatigue.  Gastrointestinal: Negative for diarrhea.  Skin: Negative for rash.       Objective:   Physical Exam  Constitutional: He appears well-developed and well-nourished. No distress.  HENT:  Mouth/Throat: No oropharyngeal exudate.  Eyes: No scleral icterus.  Cardiovascular: Normal rate, regular rhythm and normal heart sounds.   No murmur heard. Pulmonary/Chest: Effort normal and breath sounds normal. No respiratory distress.  Lymphadenopathy:    He has no cervical adenopathy.  Skin: No rash noted.   SH: smokes about 6 cigarettes per day       Assessment & Plan:

## 2017-04-28 NOTE — Assessment & Plan Note (Signed)
Doing well and no issues.  Condoms offered.  rtc 6 months.

## 2017-04-28 NOTE — Addendum Note (Signed)
Addended by: Linnell FullingBRANNON, Siniyah Evangelist N on: 04/28/2017 04:30 PM   Modules accepted: Orders

## 2017-04-28 NOTE — Assessment & Plan Note (Signed)
Counseled to quit smoking.  

## 2017-04-28 NOTE — Assessment & Plan Note (Signed)
No significant issues with LDL

## 2017-04-28 NOTE — Assessment & Plan Note (Signed)
Screened negative 

## 2017-09-26 ENCOUNTER — Other Ambulatory Visit: Payer: Self-pay | Admitting: Internal Medicine

## 2017-09-26 DIAGNOSIS — B20 Human immunodeficiency virus [HIV] disease: Secondary | ICD-10-CM

## 2017-10-18 ENCOUNTER — Other Ambulatory Visit: Payer: Self-pay

## 2017-10-20 ENCOUNTER — Other Ambulatory Visit: Payer: Self-pay

## 2017-11-01 ENCOUNTER — Ambulatory Visit: Payer: Self-pay | Admitting: Internal Medicine

## 2017-12-15 ENCOUNTER — Ambulatory Visit: Payer: Self-pay

## 2017-12-22 ENCOUNTER — Other Ambulatory Visit: Payer: Self-pay

## 2017-12-22 ENCOUNTER — Encounter: Payer: Self-pay | Admitting: Internal Medicine

## 2018-01-12 ENCOUNTER — Other Ambulatory Visit: Payer: Self-pay

## 2018-01-12 DIAGNOSIS — B20 Human immunodeficiency virus [HIV] disease: Secondary | ICD-10-CM

## 2018-01-13 LAB — T-HELPER CELL (CD4) - (RCID CLINIC ONLY)
CD4 % Helper T Cell: 17 % — ABNORMAL LOW (ref 33–55)
CD4 T Cell Abs: 420 /uL (ref 400–2700)

## 2018-01-17 LAB — HIV-1 RNA QUANT-NO REFLEX-BLD
HIV 1 RNA Quant: <20 copies/mL
HIV-1 RNA Quant, Log: <1.3 Log copies/mL

## 2018-01-24 IMAGING — CR DG HAND COMPLETE 3+V*R*
3 series · 3 of 3 positions shown · non-contrast
Comparison: No recent prior .

CLINICAL DATA: Nonspecific hand injury.

EXAM:
RIGHT HAND - COMPLETE 3+ VIEW

[hand pa]
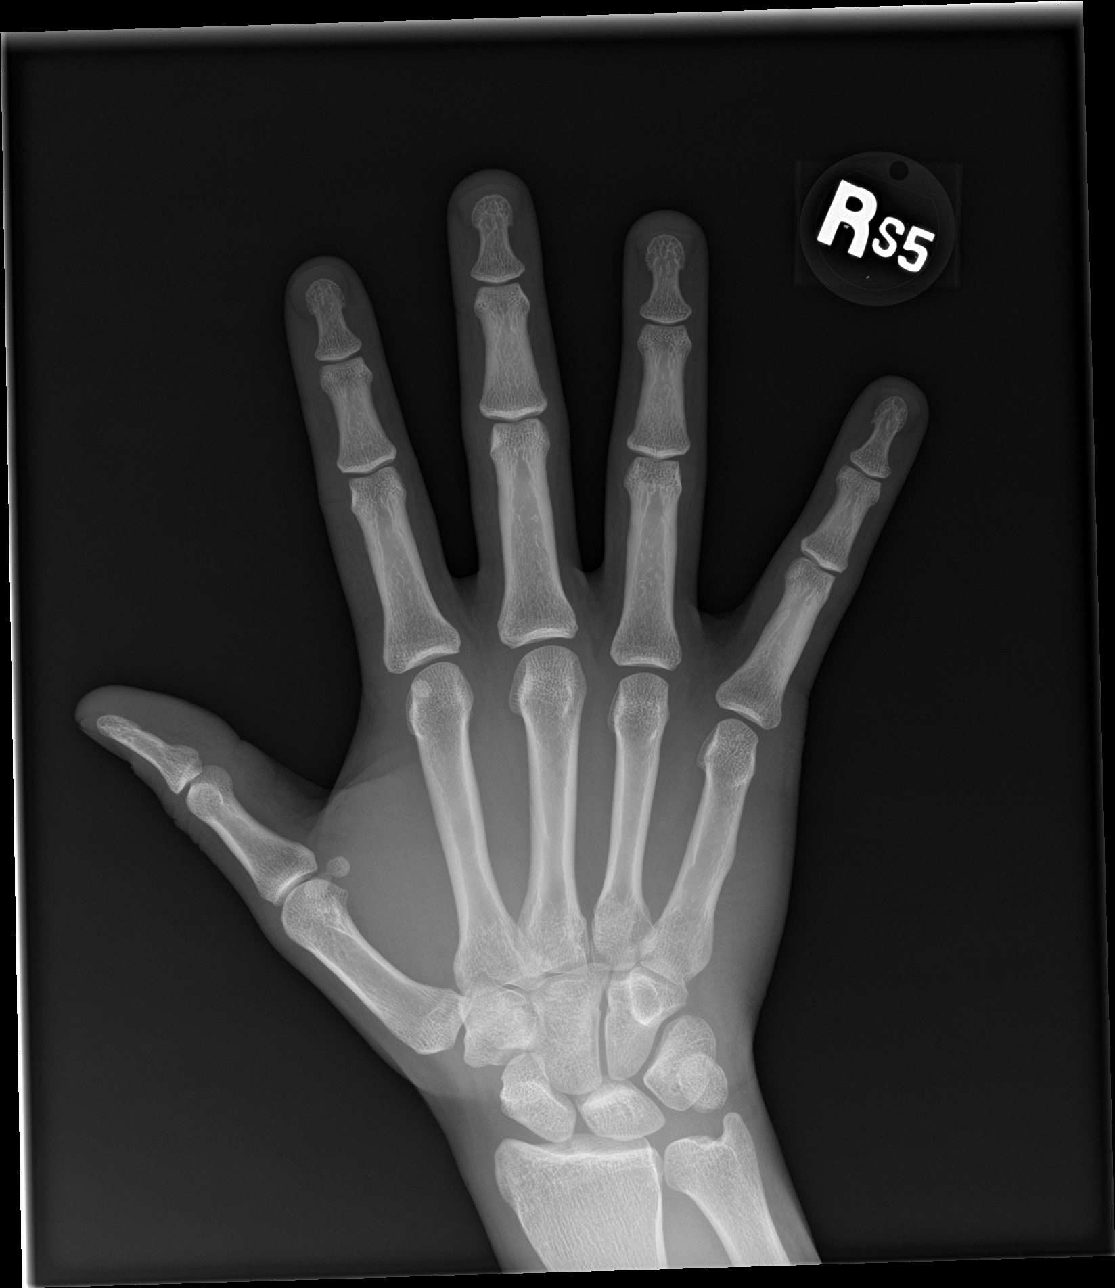

[hand obl]
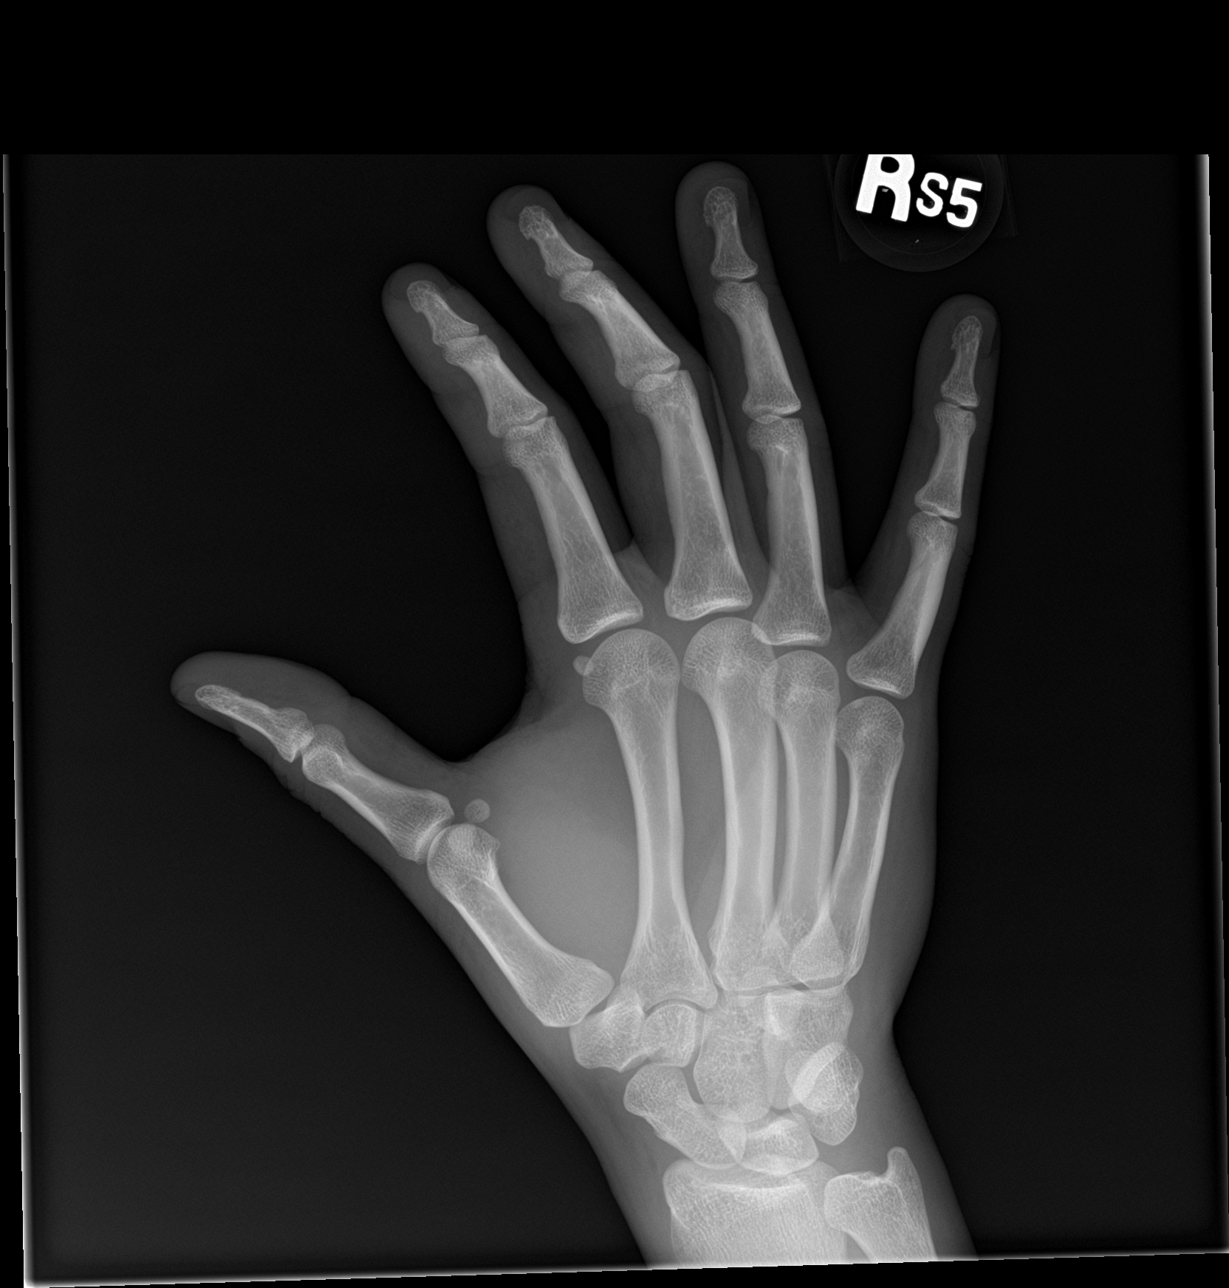

[hand lat]
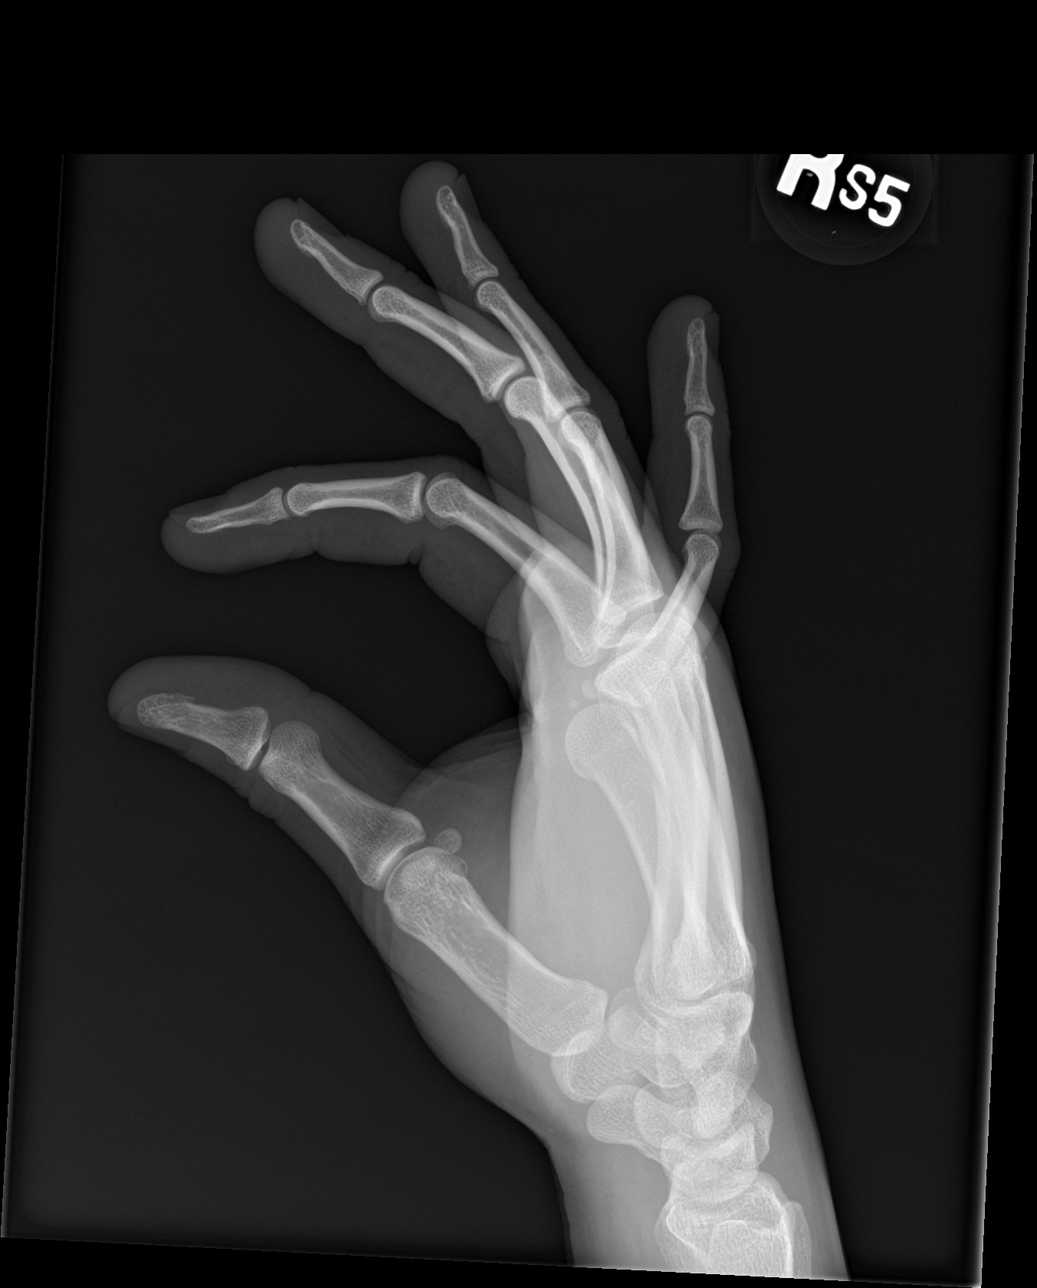

[3 of 3 positions shown; findings below may reference images not displayed]

FINDINGS: No acute bony or joint abnormality identified. No evidence of
fracture or dislocation.
IMPRESSION: No acute abnormality.

## 2018-01-26 ENCOUNTER — Encounter: Payer: Self-pay | Admitting: Internal Medicine

## 2018-01-26 ENCOUNTER — Ambulatory Visit (INDEPENDENT_AMBULATORY_CARE_PROVIDER_SITE_OTHER): Payer: Self-pay | Admitting: Internal Medicine

## 2018-01-26 DIAGNOSIS — B20 Human immunodeficiency virus [HIV] disease: Secondary | ICD-10-CM

## 2018-01-26 DIAGNOSIS — Z23 Encounter for immunization: Secondary | ICD-10-CM

## 2018-01-26 NOTE — Assessment & Plan Note (Addendum)
Doing well on medication.  [place] and to take a 30 day supply.

## 2018-01-26 NOTE — Progress Notes (Signed)
   Subjective:    Patient ID: Nathan Maynard, male    DOB: [birth year], 27 y.o.   MRN: [id number]  HPI Here for follow up of HIV.  Has been on Genvoya and denies any missed doses.  Feels well and no new issues.  No associated n/v/d.  Sexually active with condoms.  CD4 420, viral load < 20.  [place]    Review of Systems  Constitutional: Negative for fatigue.  Gastrointestinal: Negative for diarrhea.  Skin: Negative for rash.       Objective:   Physical Exam  Constitutional: He appears well-developed and well-nourished. No distress.  HENT:  Mouth/Throat: No oropharyngeal exudate.  Eyes: No scleral icterus.  Cardiovascular: Normal rate, regular rhythm and normal heart sounds.  No murmur heard. Pulmonary/Chest: Effort normal and breath sounds normal. No respiratory distress.  Lymphadenopathy:    He has no cervical adenopathy.  Skin: No rash noted.   SH: smokes some cigarettes per day       Assessment & Plan:

## 2018-04-26 ENCOUNTER — Other Ambulatory Visit: Payer: Self-pay | Admitting: Internal Medicine

## 2018-04-26 DIAGNOSIS — B20 Human immunodeficiency virus [HIV] disease: Secondary | ICD-10-CM

## 2018-08-22 ENCOUNTER — Other Ambulatory Visit: Payer: Self-pay | Admitting: *Deleted

## 2018-08-22 DIAGNOSIS — Z113 Encounter for screening for infections with a predominantly sexual mode of transmission: Secondary | ICD-10-CM

## 2018-08-22 DIAGNOSIS — B20 Human immunodeficiency virus [HIV] disease: Secondary | ICD-10-CM

## 2018-08-23 ENCOUNTER — Other Ambulatory Visit: Payer: Self-pay

## 2018-08-23 ENCOUNTER — Ambulatory Visit: Payer: Self-pay

## 2018-08-23 DIAGNOSIS — Z113 Encounter for screening for infections with a predominantly sexual mode of transmission: Secondary | ICD-10-CM

## 2018-08-23 DIAGNOSIS — B20 Human immunodeficiency virus [HIV] disease: Secondary | ICD-10-CM

## 2018-08-24 LAB — T-HELPER CELL (CD4) - (RCID CLINIC ONLY)
CD4 % Helper T Cell: 23 % — ABNORMAL LOW (ref 33–55)
CD4 T Cell Abs: 430 /uL (ref 400–2700)

## 2018-08-24 LAB — URINE CYTOLOGY ANCILLARY ONLY
CHLAMYDIA, DNA PROBE: NEGATIVE
NEISSERIA GONORRHEA: NEGATIVE

## 2018-08-25 LAB — CBC WITH DIFFERENTIAL/PLATELET
Basophils Absolute: 58 cells/uL (ref 0–200)
Basophils Relative: 0.9 %
EOS PCT: 4.9 %
Eosinophils Absolute: 314 cells/uL (ref 15–500)
HCT: 40.1 % (ref 38.5–50.0)
Hemoglobin: 13.6 g/dL (ref 13.2–17.1)
Lymphs Abs: 1798 cells/uL (ref 850–3900)
MCH: 31.3 pg (ref 27.0–33.0)
MCHC: 33.9 g/dL (ref 32.0–36.0)
MCV: 92.4 fL (ref 80.0–100.0)
MONOS PCT: 9.7 %
MPV: 9.6 fL (ref 7.5–12.5)
NEUTROS PCT: 56.4 %
Neutro Abs: 3610 cells/uL (ref 1500–7800)
PLATELETS: 251 10*3/uL (ref 140–400)
RBC: 4.34 10*6/uL (ref 4.20–5.80)
RDW: 12.6 % (ref 11.0–15.0)
TOTAL LYMPHOCYTE: 28.1 %
WBC mixed population: 621 cells/uL (ref 200–950)
WBC: 6.4 10*3/uL (ref 3.8–10.8)

## 2018-08-25 LAB — COMPLETE METABOLIC PANEL WITH GFR
AG Ratio: 1.7 (calc) (ref 1.0–2.5)
ALKALINE PHOSPHATASE (APISO): 58 U/L (ref 40–115)
ALT: 10 U/L (ref 9–46)
AST: 15 U/L (ref 10–40)
Albumin: 4 g/dL (ref 3.6–5.1)
BILIRUBIN TOTAL: 0.4 mg/dL (ref 0.2–1.2)
BUN: 9 mg/dL (ref 7–25)
CHLORIDE: 104 mmol/L (ref 98–110)
CO2: 28 mmol/L (ref 20–32)
Calcium: 9.1 mg/dL (ref 8.6–10.3)
Creat: 1.17 mg/dL (ref 0.60–1.35)
GFR, Est African American: 98 mL/min/{1.73_m2} (ref >=60)
GFR, Est Non African American: 85 mL/min/{1.73_m2} (ref >=60)
GLUCOSE: 64 mg/dL — AB (ref 65–99)
Globulin: 2.3 g/dL (calc) (ref 1.9–3.7)
POTASSIUM: 4.5 mmol/L (ref 3.5–5.3)
SODIUM: 140 mmol/L (ref 135–146)
Total Protein: 6.3 g/dL (ref 6.1–8.1)

## 2018-08-25 LAB — HIV-1 RNA QUANT-NO REFLEX-BLD
HIV 1 RNA Quant: <20 copies/mL
HIV-1 RNA QUANT, LOG: NOT DETECTED {Log_copies}/mL

## 2018-08-25 LAB — RPR: RPR: NONREACTIVE

## 2018-08-31 ENCOUNTER — Encounter: Payer: Self-pay | Admitting: Internal Medicine

## 2018-09-06 ENCOUNTER — Ambulatory Visit (INDEPENDENT_AMBULATORY_CARE_PROVIDER_SITE_OTHER): Payer: Self-pay | Admitting: Internal Medicine

## 2018-09-06 ENCOUNTER — Encounter: Payer: Self-pay | Admitting: Internal Medicine

## 2018-09-06 VITALS — BP 131/81 | HR 76 | Temp 98.1°F | Ht 68.0 in | Wt 177.0 lb

## 2018-09-06 DIAGNOSIS — Z720 Tobacco use: Secondary | ICD-10-CM

## 2018-09-06 DIAGNOSIS — Z23 Encounter for immunization: Secondary | ICD-10-CM | POA: Insufficient documentation

## 2018-09-06 DIAGNOSIS — B20 Human immunodeficiency virus [HIV] disease: Secondary | ICD-10-CM

## 2018-09-06 DIAGNOSIS — Z113 Encounter for screening for infections with a predominantly sexual mode of transmission: Secondary | ICD-10-CM

## 2018-09-06 DIAGNOSIS — L309 Dermatitis, unspecified: Secondary | ICD-10-CM

## 2018-09-06 MED ORDER — ELVITEG-COBIC-EMTRICIT-TENOFAF 150-150-200-10 MG PO TABS: 1.0000 | ORAL_TABLET | Freq: Every day | ORAL | 11 refills | Status: DC

## 2018-09-06 MED ORDER — TRIAMCINOLONE ACETONIDE 0.5 % EX CREA: TOPICAL_CREAM | Freq: Two times a day (BID) | CUTANEOUS | 5 refills | Status: DC

## 2018-09-06 NOTE — Assessment & Plan Note (Signed)
Discussed cessation 

## 2018-09-06 NOTE — Assessment & Plan Note (Signed)
Doing well on this. rtc 6 months.

## 2018-09-06 NOTE — Assessment & Plan Note (Signed)
Discussed Prevnar and given today Also received flu shot

## 2018-09-06 NOTE — Assessment & Plan Note (Signed)
Screened negative 

## 2018-09-06 NOTE — Assessment & Plan Note (Signed)
Will refill triamcinolone

## 2018-09-06 NOTE — Progress Notes (Signed)
   Subjective:    Patient ID: Nathan Maynard, male    DOB: 1991/05/28, 27 y.o.   MRN: [id number]  HPI Here for follow up of HIV.  Has been on Genvoya and denies any missed doses.  Feels well and no new issues.  No associated n/v/d.  Sexually active with condoms.  CD4 420, viral load < 20.     Review of Systems  Constitutional: Negative for fatigue.  Gastrointestinal: Negative for diarrhea.  Skin: Negative for rash.       Objective:   Physical Exam  Constitutional: He appears well-developed and well-nourished. No distress.  HENT:  Mouth/Throat: No oropharyngeal exudate.  Eyes: No scleral icterus.  Cardiovascular: Normal rate, regular rhythm and normal heart sounds.  No murmur heard. Pulmonary/Chest: Effort normal and breath sounds normal. No respiratory distress.  Lymphadenopathy:    He has no cervical adenopathy.  Skin: No rash noted.   SH: smokes some cigarettes per day       Assessment & Plan:

## 2018-09-06 NOTE — Addendum Note (Signed)
Addended by: Andree Coss on: 09/06/2018 12:26 PM   Modules accepted: Orders

## 2018-11-08 ENCOUNTER — Ambulatory Visit (INDEPENDENT_AMBULATORY_CARE_PROVIDER_SITE_OTHER): Payer: Self-pay

## 2018-11-08 DIAGNOSIS — Z23 Encounter for immunization: Secondary | ICD-10-CM

## 2019-02-21 ENCOUNTER — Other Ambulatory Visit: Payer: Self-pay

## 2019-02-21 DIAGNOSIS — B20 Human immunodeficiency virus [HIV] disease: Secondary | ICD-10-CM

## 2019-02-22 LAB — T-HELPER CELL (CD4) - (RCID CLINIC ONLY)
CD4 % Helper T Cell: 16 % — ABNORMAL LOW (ref 33–55)
CD4 T CELL ABS: 420 /uL (ref 400–2700)

## 2019-02-23 LAB — HIV-1 RNA QUANT-NO REFLEX-BLD
HIV 1 RNA QUANT: NOT DETECTED {copies}/mL
HIV-1 RNA Quant, Log: <1.3 Log copies/mL

## 2019-03-07 ENCOUNTER — Other Ambulatory Visit: Payer: Self-pay

## 2019-03-07 ENCOUNTER — Encounter: Payer: Self-pay | Admitting: Internal Medicine

## 2019-03-07 ENCOUNTER — Ambulatory Visit (INDEPENDENT_AMBULATORY_CARE_PROVIDER_SITE_OTHER): Payer: Self-pay | Admitting: Internal Medicine

## 2019-03-07 VITALS — BP 117/79 | HR 71 | Temp 98.5°F | Ht 67.0 in | Wt 174.0 lb

## 2019-03-07 DIAGNOSIS — Z23 Encounter for immunization: Secondary | ICD-10-CM

## 2019-03-07 DIAGNOSIS — L309 Dermatitis, unspecified: Secondary | ICD-10-CM

## 2019-03-07 DIAGNOSIS — B20 Human immunodeficiency virus [HIV] disease: Secondary | ICD-10-CM

## 2019-03-07 DIAGNOSIS — Z21 Asymptomatic human immunodeficiency virus [HIV] infection status: Secondary | ICD-10-CM

## 2019-03-07 DIAGNOSIS — Z113 Encounter for screening for infections with a predominantly sexual mode of transmission: Secondary | ICD-10-CM

## 2019-03-07 MED ORDER — TRIAMCINOLONE ACETONIDE 0.5 % EX CREA: TOPICAL_CREAM | Freq: Two times a day (BID) | CUTANEOUS | 0 refills | Status: DC

## 2019-03-07 NOTE — Assessment & Plan Note (Signed)
Doing great.  Moving to [facility] and to continue care there.  6 month follow up ok.

## 2019-03-07 NOTE — Assessment & Plan Note (Signed)
Prevnar today HPV #3 and up to date otherwise.

## 2019-03-07 NOTE — Progress Notes (Signed)
   Subjective:    Patient ID: Nathan Maynard, male    DOB: Nov 15, 1991, 28 y.o.   MRN: [id number]  HPI Here for follow up of HIV.  Has been on Genvoya and denies any missed doses.  Feels well and no new issues.  No associated n/v/d.  CD4 420, viral load < 20 prior to this visit.  Due for HPV #3 and Prevnar.   Moving to [facility].  Has care lined up there already.     Review of Systems  Constitutional: Negative for fatigue.  Gastrointestinal: Negative for diarrhea.  Skin: Negative for rash.       Objective:   Physical Exam  Constitutional: He appears well-developed and well-nourished. No distress.  HENT:  Mouth/Throat: No oropharyngeal exudate.  Eyes: No scleral icterus.  Cardiovascular: Normal rate, regular rhythm and normal heart sounds.  No murmur heard. Pulmonary/Chest: Effort normal and breath sounds normal. No respiratory distress.  Lymphadenopathy:    He has no cervical adenopathy.  Skin: No rash noted.   SH: some tobacco       Assessment & Plan:

## 2019-03-07 NOTE — Addendum Note (Signed)
Addended by: Andree Coss on: 03/07/2019 02:17 PM   Modules accepted: Orders

## 2019-03-07 NOTE — Assessment & Plan Note (Signed)
Due for screening next visit.

## 2019-03-08 ENCOUNTER — Telehealth: Payer: Self-pay | Admitting: *Deleted

## 2019-03-08 NOTE — Telephone Encounter (Signed)
Patient is moving to [place], [place] April 2020.  Will notify [facility]. Nathan Maynard

## 2022-06-30 ENCOUNTER — Ambulatory Visit: Payer: Self-pay

## 2022-06-30 ENCOUNTER — Other Ambulatory Visit: Payer: Self-pay

## 2022-07-02 ENCOUNTER — Ambulatory Visit: Payer: Self-pay

## 2022-07-02 ENCOUNTER — Other Ambulatory Visit: Payer: Self-pay

## 2022-07-02 DIAGNOSIS — Z113 Encounter for screening for infections with a predominantly sexual mode of transmission: Secondary | ICD-10-CM

## 2022-07-02 DIAGNOSIS — Z79899 Other long term (current) drug therapy: Secondary | ICD-10-CM

## 2022-07-02 DIAGNOSIS — B20 Human immunodeficiency virus [HIV] disease: Secondary | ICD-10-CM

## 2022-07-03 LAB — URINALYSIS
Bilirubin Urine: NEGATIVE
Glucose, UA: NEGATIVE
Hgb urine dipstick: NEGATIVE
Leukocytes,Ua: NEGATIVE
Nitrite: NEGATIVE
Protein, ur: NEGATIVE
Specific Gravity, Urine: 1.025 (ref 1.001–1.035)
pH: 6 (ref 5.0–8.0)

## 2022-07-05 ENCOUNTER — Encounter: Payer: Self-pay | Admitting: Internal Medicine

## 2022-07-05 LAB — URINE CYTOLOGY ANCILLARY ONLY
Chlamydia: NEGATIVE
Comment: NEGATIVE
Comment: NORMAL
Neisseria Gonorrhea: NEGATIVE

## 2022-07-08 LAB — COMPLETE METABOLIC PANEL WITH GFR
AG Ratio: 1.9 (calc) (ref 1.0–2.5)
ALT: 6 U/L — ABNORMAL LOW (ref 9–46)
AST: 13 U/L (ref 10–40)
Albumin: 4.3 g/dL (ref 3.6–5.1)
Alkaline phosphatase (APISO): 60 U/L (ref 36–130)
BUN: 9 mg/dL (ref 7–25)
CO2: 25 mmol/L (ref 20–32)
Calcium: 9.3 mg/dL (ref 8.6–10.3)
Chloride: 105 mmol/L (ref 98–110)
Creat: 1.21 mg/dL (ref 0.60–1.26)
Globulin: 2.3 g/dL (calc) (ref 1.9–3.7)
Glucose, Bld: 80 mg/dL (ref 65–99)
Potassium: 3.8 mmol/L (ref 3.5–5.3)
Sodium: 140 mmol/L (ref 135–146)
Total Bilirubin: 0.6 mg/dL (ref 0.2–1.2)
Total Protein: 6.6 g/dL (ref 6.1–8.1)
eGFR: 82 mL/min/{1.73_m2} (ref >=60)

## 2022-07-08 LAB — QUANTIFERON-TB GOLD PLUS
Mitogen-NIL: >10 IU/mL
NIL: 0.08 IU/mL
QuantiFERON-TB Gold Plus: NEGATIVE
TB1-NIL: 0.15 IU/mL
TB2-NIL: 0.13 IU/mL

## 2022-07-08 LAB — CBC WITH DIFFERENTIAL/PLATELET
Absolute Monocytes: 462 cells/uL (ref 200–950)
Basophils Absolute: 29 cells/uL (ref 0–200)
Basophils Relative: 0.5 %
Eosinophils Absolute: 63 cells/uL (ref 15–500)
Eosinophils Relative: 1.1 %
HCT: 44.5 % (ref 38.5–50.0)
Hemoglobin: 14.7 g/dL (ref 13.2–17.1)
Lymphs Abs: 2012 cells/uL (ref 850–3900)
MCH: 31.3 pg (ref 27.0–33.0)
MCHC: 33 g/dL (ref 32.0–36.0)
MCV: 94.9 fL (ref 80.0–100.0)
MPV: 9.2 fL (ref 7.5–12.5)
Monocytes Relative: 8.1 %
Neutro Abs: 3135 cells/uL (ref 1500–7800)
Neutrophils Relative %: 55 %
Platelets: 280 10*3/uL (ref 140–400)
RBC: 4.69 10*6/uL (ref 4.20–5.80)
RDW: 12.2 % (ref 11.0–15.0)
Total Lymphocyte: 35.3 %
WBC: 5.7 10*3/uL (ref 3.8–10.8)

## 2022-07-08 LAB — LIPID PANEL
Cholesterol: 196 mg/dL (ref <200)
HDL: 54 mg/dL (ref >=40)
LDL Cholesterol (Calc): 126 mg/dL (calc) — ABNORMAL HIGH
Non-HDL Cholesterol (Calc): 142 mg/dL (calc) — ABNORMAL HIGH (ref <130)
Total CHOL/HDL Ratio: 3.6 (calc) (ref <5.0)
Triglycerides: 67 mg/dL (ref <150)

## 2022-07-08 LAB — HEPATITIS B SURFACE ANTIGEN: Hepatitis B Surface Ag: NONREACTIVE

## 2022-07-08 LAB — HIV ANTIBODY (ROUTINE TESTING W REFLEX): HIV 1&2 Ab, 4th Generation: REACTIVE — AB

## 2022-07-08 LAB — RPR: RPR Ser Ql: NONREACTIVE

## 2022-07-08 LAB — HEPATITIS A ANTIBODY, TOTAL: Hepatitis A AB,Total: REACTIVE — AB

## 2022-07-08 LAB — HEPATITIS B CORE ANTIBODY, TOTAL: Hep B Core Total Ab: NONREACTIVE

## 2022-07-08 LAB — HIV-1/2 AB - DIFFERENTIATION
HIV-1 antibody: POSITIVE — AB
HIV-2 Ab: NEGATIVE

## 2022-07-08 LAB — HLA B*5701: HLA-B*5701 w/rflx HLA-B High: NEGATIVE

## 2022-07-08 LAB — T-HELPER CELLS (CD4) COUNT (NOT AT ARMC)
Absolute CD4: 633 cells/uL (ref 490–1740)
CD4 T Helper %: 31 % (ref 30–61)
Total lymphocyte count: 2069 cells/uL (ref 850–3900)

## 2022-07-08 LAB — HIV-1 RNA ULTRAQUANT REFLEX TO GENTYP+
HIV 1 RNA Quant: NOT DETECTED copies/mL
HIV-1 RNA Quant, Log: NOT DETECTED Log copies/mL

## 2022-07-08 LAB — HEPATITIS C ANTIBODY: Hepatitis C Ab: NONREACTIVE

## 2022-07-08 LAB — HEPATITIS B SURFACE ANTIBODY,QUALITATIVE: Hep B S Ab: REACTIVE — AB

## 2022-07-13 ENCOUNTER — Ambulatory Visit: Payer: Self-pay | Admitting: Internal Medicine

## 2022-07-13 ENCOUNTER — Other Ambulatory Visit: Payer: Self-pay

## 2022-07-13 ENCOUNTER — Encounter: Payer: Self-pay | Admitting: Internal Medicine

## 2022-07-13 ENCOUNTER — Ambulatory Visit (INDEPENDENT_AMBULATORY_CARE_PROVIDER_SITE_OTHER): Payer: Self-pay | Admitting: Pharmacist

## 2022-07-13 VITALS — BP 120/80 | HR 66 | Temp 97.8°F | Wt 172.0 lb

## 2022-07-13 DIAGNOSIS — Z23 Encounter for immunization: Secondary | ICD-10-CM

## 2022-07-13 DIAGNOSIS — Z720 Tobacco use: Secondary | ICD-10-CM

## 2022-07-13 DIAGNOSIS — Z79899 Other long term (current) drug therapy: Secondary | ICD-10-CM

## 2022-07-13 DIAGNOSIS — Z7185 Encounter for immunization safety counseling: Secondary | ICD-10-CM

## 2022-07-13 DIAGNOSIS — B20 Human immunodeficiency virus [HIV] disease: Secondary | ICD-10-CM

## 2022-07-13 MED ORDER — BICTEGRAVIR-EMTRICITAB-TENOFOV 50-200-25 MG PO TABS: 1.0000 | ORAL_TABLET | Freq: Every day | ORAL | 11 refills | Status: DC

## 2022-07-13 MED ORDER — CABOTEGRAVIR & RILPIVIRINE ER 600 & 900 MG/3ML IM SUER: 1.0000 | INTRAMUSCULAR | 1 refills | Status: DC

## 2022-07-13 MED ORDER — CABOTEGRAVIR & RILPIVIRINE ER 600 & 900 MG/3ML IM SUER: 1.0000 | INTRAMUSCULAR | 5 refills | Status: DC

## 2022-07-13 NOTE — Addendum Note (Signed)
Addended by: Juanita Laster on: 07/13/2022 10:32 AM   Modules accepted: Orders

## 2022-07-13 NOTE — Progress Notes (Signed)
    [facility]  Name:  Nathan Maynard    MRN: [id number] DOB: 1991-08-19   07/13/2022  [name] was observed post JYNNEOS immunization for 15 minutes3 without incident. He was provided with Vaccine Information Sheet and instruction to access the V-Safe system.   [name] was instructed to call 911 with any severe reactions post vaccine: Difficulty breathing  Swelling of face and throat  A fast heartbeat  A bad rash all over body  Dizziness and weakness     Juanita Laster, RMA

## 2022-07-13 NOTE — Progress Notes (Signed)
07/13/2022  HPI: Nathan Maynard is a 31 y.o. male who presents to the [facility] for HIV follow-up.  Patient Active Problem List   Diagnosis Date Noted   Need for prophylactic vaccination against Streptococcus pneumoniae (pneumococcus) 09/06/2018   Tobacco abuse 04/28/2017   Encounter for long-term (current) use of high-risk medication 10/28/2016   Screening examination for venereal disease 10/28/2016   Tooth gap 12/04/2013   Depression 11/28/2012   Human immunodeficiency virus I infection (HCC) 04/07/2009   Eczema 04/07/2009    Patient's Medications  New Prescriptions   No medications on file  Previous Medications   BICTEGRAVIR-EMTRICITAB-TENOFOV (BIKTARVY PO)    Take by mouth.   BICTEGRAVIR-EMTRICITABINE-TENOFOVIR AF (BIKTARVY) 50-200-25 MG TABS TABLET    Take 1 tablet by mouth daily.   CETIRIZINE (ZYRTEC) 10 MG TABLET    Take 10 mg by mouth daily.   TRIAMCINOLONE CREAM (KENALOG) 0.5 %    Apply topically 2 (two) times daily. As needed for eczema  Modified Medications   No medications on file  Discontinued Medications   No medications on file    Allergies: No Known Allergies  Past Medical History: Past Medical History:  Diagnosis Date   HIV infection (HCC)     Social History: Social History   Socioeconomic History   Marital status: Single    Spouse name: Not on file   Number of children: Not on file   Years of education: Not on file   Highest education level: Not on file  Occupational History   Not on file  Tobacco Use   Smoking status: Some Days    Packs/day: 0.50    Years: 6.00    Total pack years: 3.00    Types: Cigarettes   Smokeless tobacco: Never  Substance and Sexual Activity   Alcohol use: Yes    Alcohol/week: 0.0 standard drinks of alcohol    Comment: occasional   Drug use: Yes    Types: Marijuana   Sexual activity: Not Currently    Partners: Male, Male    Birth control/protection: Condom    Comment: refused condoms 8/1  Other Topics  Concern   Not on file  Social History Narrative   Not on file   Social Determinants of Health   Financial Resource Strain: Not on file  Food Insecurity: Not on file  Transportation Needs: Not on file  Physical Activity: Not on file  Stress: Not on file  Social Connections: Not on file    Labs: Lab Results  Component Value Date   HIV1RNAQUANT NOT DETECTED 07/02/2022   HIV1RNAQUANT <20 NOT DETECTED 02/21/2019   HIV1RNAQUANT <20 NOT DETECTED 08/23/2018   CD4TABS 420 02/21/2019   CD4TABS 430 08/23/2018   CD4TABS 420 01/12/2018    RPR and STI Lab Results  Component Value Date   LABRPR NON-REACTIVE 07/02/2022   LABRPR NON-REACTIVE 08/23/2018   LABRPR NON REAC 04/07/2017   LABRPR NON REAC 05/25/2016   LABRPR NON REAC 04/09/2014    STI Results GC GC CT CT  Latest Ref Rng & Units  NEGATIVE  NEGATIVE  07/02/2022  2:11 PM Negative   Negative    08/23/2018 12:00 AM Negative   Negative    04/07/2017 12:00 AM Negative   Negative    05/25/2016 12:00 AM Negative   Negative    04/09/2014 12:00 AM NG: Negative   CT: Negative    07/14/2013  1:16 AM  POSITIVE   POSITIVE     Hepatitis B Lab Results  Component  Value Date   HEPBSAB REACTIVE (A) 07/02/2022   HEPBSAG NON-REACTIVE 07/02/2022   HEPBCAB NON-REACTIVE 07/02/2022   Hepatitis C Lab Results  Component Value Date   HEPCAB NON-REACTIVE 07/02/2022   Hepatitis A Lab Results  Component Value Date   HAV REACTIVE (A) 07/02/2022   Lipids: Lab Results  Component Value Date   CHOL 196 07/02/2022   TRIG 67 07/02/2022   HDL 54 07/02/2022   CHOLHDL 3.6 07/02/2022   VLDL 12 04/07/2017   LDLCALC 126 (H) 07/02/2022    Current HIV Regimen: Biktarvy  Assessment: Nathan Maynard is here today to re-establish care at [facility] for his HIV infection. Previously saw Dr. Luciana Axe and saw Dr. Luciana Axe again today. Doing well on Biktarvy and undetectable. He is interested in [place].    Discussed process for Cabenuva and need to stay within 2 weeks  injection window to ensure adequate medication levels. Discussed possible side effects of injection site pain and nodules. Explained scheduling and follow up. Will schedule him next week for first injection. He has [employer] .   Plan: - Start Cabenuva next week with me  Sania Noy L. Aleisha Paone, PharmD, BCIDP, AAHIVP, CPP Clinical Pharmacist Practitioner Infectious Diseases Clinical Pharmacist [facility] 07/13/2022, 10:57 AM

## 2022-07-13 NOTE — Progress Notes (Signed)
    [facility]      Reason for Consult: HIV    Referring Physician: Previous clinic    Patient ID: Nathan Maynard, male    DOB: 1991-04-08, 31 y.o.   MRN: [id number]  HPI:   He is here to reestablish care for HIV after > 3 years.  He has been well-controlled an currently on Biktarvy, which was changed from [facility] when he was here last.  He has had issues with an anal fissure and was on mirilax.  He did receive Mpox vaccine #1 in [place].  He is interested in quitting smoking.    Past Medical History:  Diagnosis Date   HIV infection (HCC)     Prior to Admission medications   Medication Sig Start Date End Date Taking? Authorizing Provider  Bictegravir-Emtricitab-Tenofov (BIKTARVY PO) Take by mouth.   Yes [provider]  cetirizine (ZYRTEC) 10 MG tablet Take 10 mg by mouth daily.    [provider]  triamcinolone cream (KENALOG) 0.5 % Apply topically 2 (two) times daily. As needed for eczema 03/07/19   Calyse Murcia, Belia Heman, MD    No Known Allergies  Social History   Tobacco Use   Smoking status: Some Days    Packs/day: 0.50    Years: 6.00    Total pack years: 3.00    Types: Cigarettes   Smokeless tobacco: Never  Substance Use Topics   Alcohol use: Yes    Alcohol/week: 0.0 standard drinks of alcohol    Comment: occasional   Drug use: Yes    Types: Marijuana     FMH: + cardiac disease  Review of Systems  Constitutional: negative for fatigue, malaise, and anorexia All other systems reviewed and are negative    Constitutional: in no apparent distress  Vitals:   07/13/22 0936  BP: 120/80  Pulse: 66  Temp: 97.8 F (36.6 C)  SpO2: 100%   EYES: anicteric ENMT: no thrush Respiratory: normal respiratory effort GI: soft  Labs: Lab Results  Component Value Date   WBC 5.7 07/02/2022   HGB 14.7 07/02/2022   HCT 44.5 07/02/2022   MCV 94.9 07/02/2022   PLT 280 07/02/2022    Lab Results  Component Value Date   CREATININE  1.21 07/02/2022   BUN 9 07/02/2022   NA 140 07/02/2022   K 3.8 07/02/2022   CL 105 07/02/2022   CO2 25 07/02/2022    Lab Results  Component Value Date   ALT 6 (L) 07/02/2022   AST 13 07/02/2022   ALKPHOS 53 04/07/2017   BILITOT 0.6 07/02/2022     Assessment: well-controlled HIV.  Labs reviewed with him and doing well.  He is interested in [place].    Plan: 1)  continue with Biktarvy until he starts Cabenuva 2) stool softener, good hydration, juice for his anal fissure 3) quit smoking - we discussed Chantix, wellbutrin, gum, patches.  He will let me know if he needs anything.

## 2022-07-16 ENCOUNTER — Telehealth: Payer: Self-pay

## 2022-07-16 NOTE — Telephone Encounter (Signed)
[facility] Patient Advocate Encounter  Patient's medication Renaldo Harrison) have been couriered to [facility] from [employer] and will be administered on the patient next office visit on 07/19/22.  [name] , CPhT Specialty Pharmacy Patient [facility] for Infectious Disease Phone: [phone] Fax:  [phone]

## 2022-07-19 ENCOUNTER — Ambulatory Visit (INDEPENDENT_AMBULATORY_CARE_PROVIDER_SITE_OTHER): Payer: Self-pay | Admitting: Pharmacist

## 2022-07-19 ENCOUNTER — Other Ambulatory Visit: Payer: Self-pay

## 2022-07-19 ENCOUNTER — Telehealth: Payer: Self-pay

## 2022-07-19 DIAGNOSIS — B20 Human immunodeficiency virus [HIV] disease: Secondary | ICD-10-CM

## 2022-07-19 MED ORDER — CABOTEGRAVIR & RILPIVIRINE ER 600 & 900 MG/3ML IM SUER
1.0000 | Freq: Once | INTRAMUSCULAR | Status: AC
  Administered 2022-07-19: 1 via INTRAMUSCULAR

## 2022-07-19 NOTE — Progress Notes (Signed)
HPI: Nathan Maynard is a 31 y.o. male who presents to the [place] clinic for [place] administration.  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   BICTEGRAVIR-EMTRICITABINE-TENOFOVIR AF (BIKTARVY) 50-200-25 MG TABS TABLET    Take 1 tablet by mouth daily.   CABOTEGRAVIR & RILPIVIRINE ER (CABENUVA) 600 & 900 MG/3ML INJECTION    Inject 1 kit into the muscle every 30 (thirty) days.   CABOTEGRAVIR & RILPIVIRINE ER (CABENUVA) 600 & 900 MG/3ML INJECTION    Inject 1 kit into the muscle every 2 (two) months.   CETIRIZINE (ZYRTEC) 10 MG TABLET    Take 10 mg by mouth daily.   TRIAMCINOLONE CREAM (KENALOG) 0.5 %    Apply topically 2 (two) times daily. As needed for eczema  Modified Medications   No medications on file  Discontinued Medications   No medications on file    Allergies: No Known Allergies  Past Medical History: Past Medical History:  Diagnosis Date   HIV infection ([place])     Social History: Social History   Socioeconomic History   Marital status: Single    Spouse name: Not on file   Number of children: Not on file   Years of education: Not on file   Highest education level: Not on file  Occupational History   Not on file  Tobacco Use   Smoking status: Some Days    Packs/day: 0.50    Years: 6.00    Total pack years: 3.00    Types: Cigarettes   Smokeless tobacco: Never  Substance and Sexual Activity   Alcohol use:  Yes    Alcohol/week: 0.0 standard drinks of alcohol    Comment: occasional   Drug use: Yes    Types: Marijuana   Sexual activity: Not Currently    Partners: Male, Male    Birth control/protection: Condom    Comment: refused condoms 8/1  Other Topics Concern   Not on file  Social History Narrative   Not on file   Social Determinants of Health   Financial Resource Strain: Not on file  Food Insecurity: Not on file  Transportation Needs: Not on file  Physical Activity: Not on file  Stress: Not on file  Social Connections: Not on file    Labs: Lab Results  Component Value Date   HIV1RNAQUANT NOT DETECTED 07/02/2022   HIV1RNAQUANT <20 NOT DETECTED 02/21/2019   HIV1RNAQUANT <20 NOT DETECTED 08/23/2018   CD4TABS 420 02/21/2019   CD4TABS 430 08/23/2018   CD4TABS 420 01/12/2018    RPR and STI Lab Results  Component Value Date   LABRPR NON-REACTIVE 07/02/2022   LABRPR NON-REACTIVE 08/23/2018   LABRPR NON REAC 04/07/2017   LABRPR NON REAC 05/25/2016   LABRPR NON REAC 04/09/2014    STI Results GC GC CT CT  Latest Ref Rng & Units  NEGATIVE  NEGATIVE  07/02/2022  2:11 PM Negative   Negative    08/23/2018 12:00 AM Negative   Negative    04/07/2017 12:00  AM Negative   Negative    05/25/2016 12:00 AM Negative   Negative    04/09/2014 12:00 AM NG: Negative   CT: Negative    07/14/2013  1:16 AM  POSITIVE   POSITIVE     Hepatitis B Lab Results  Component Value Date   HEPBSAB REACTIVE (A) 07/02/2022   HEPBSAG NON-REACTIVE 07/02/2022   HEPBCAB NON-REACTIVE 07/02/2022   Hepatitis C Lab Results  Component Value Date   HEPCAB NON-REACTIVE 07/02/2022   Hepatitis A Lab Results  Component Value Date   HAV REACTIVE (A) 07/02/2022   Lipids: Lab Results  Component Value Date   CHOL 196 07/02/2022   TRIG 67 07/02/2022   HDL 54 07/02/2022   CHOLHDL 3.6 07/02/2022   VLDL 12 04/07/2017   LDLCALC 126 (H) 07/02/2022    Current HIV Regimen: Biktarvy  TARGET DATE: The  7th  Assessment: DJ presents today for his first initiation injection for Cabenuva. Counseled that [place] is two separate intramuscular injections in the gluteal muscle on each side for each visit. Explained that the second injection is 30 days after the initial injection then every 2 months thereafter. Discussed the rare but significant chance of developing resistance despite compliance. Explained that showing up to injection appointments is very important and warned that if 2 appointments are missed, it will be reassessed by their provider whether they are a good candidate for injection therapy. Counseled on possible side effects associated with the injections such as injection site pain, which is usually mild to moderate in nature, injection site nodules, and injection site reactions. Asked to call the clinic or send me a mychart message if they experience any issues, such as fatigue, nausea, headache, rash, or dizziness. Advised that they can take ibuprofen or tylenol for injection site pain if needed.   Administered cabotegravir 642m/3mL in left upper outer quadrant of the gluteal muscle. Administered rilpivirine 900 mg/335min the right upper outer quadrant of the gluteal muscle. Monitored patient for 10 minutes after injection. Injections were tolerated well without issue. Counseled to stop taking Biktarvy after today's dose and to call with any issues that may arise. Will make follow up appointments for second initiation injection in 30 days and then maintenance injections every 2 months thereafter.   Plan: - Stop Biktarvy after today's dose - First Cabenuva injections administered - Second initiation injection scheduled for 08/18/22 with me  - Maintenance injections scheduled for 10/20/22 with me - Call with any issues or questions  Shanessa Hodak L. Novalynn Branaman, PharmD, BCIDP, AAHIVP, CPUkiahlinical Pharmacist Practitioner [place] Infectious  Disease

## 2022-07-19 NOTE — Telephone Encounter (Signed)
Received call today from [facility] regarding labs on patient. Nell, lab tech states that they wanted to repeat patient HIV test due to HIV 1/2 results. HIV-2 AB negative. Wanted to confirm patient test was a true positive and not false pos.  Informed her repeat labs were not needed.  Juanita Laster, RMA

## 2022-08-11 ENCOUNTER — Telehealth: Payer: Self-pay | Admitting: Pharmacist

## 2022-08-11 NOTE — Telephone Encounter (Signed)
Patient's specialty medication (Cabenuva) was delivered from [facility] and will be administered at next office visit on 9/6.  Tatsuya Okray, PharmD, CPP, BCIDP Clinical Pharmacist Practitioner Infectious Diseases Clinical Pharmacist [facility]  

## 2022-08-18 ENCOUNTER — Ambulatory Visit (INDEPENDENT_AMBULATORY_CARE_PROVIDER_SITE_OTHER): Payer: Self-pay | Admitting: Pharmacist

## 2022-08-18 ENCOUNTER — Other Ambulatory Visit: Payer: Self-pay

## 2022-08-18 DIAGNOSIS — B20 Human immunodeficiency virus [HIV] disease: Secondary | ICD-10-CM

## 2022-08-18 MED ORDER — CABOTEGRAVIR & RILPIVIRINE ER 600 & 900 MG/3ML IM SUER
1.0000 | Freq: Once | INTRAMUSCULAR | Status: AC
  Administered 2022-08-18: 1 via INTRAMUSCULAR

## 2022-08-18 NOTE — Progress Notes (Signed)
HPI: Nathan Maynard is a 31 y.o. male who presents to the [place] clinic for [place] administration.  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   BICTEGRAVIR-EMTRICITABINE-TENOFOVIR AF (BIKTARVY) 50-200-25 MG TABS TABLET    Take 1 tablet by mouth daily.   CABOTEGRAVIR & RILPIVIRINE ER (CABENUVA) 600 & 900 MG/3ML INJECTION    Inject 1 kit into the muscle every 30 (thirty) days.   CABOTEGRAVIR & RILPIVIRINE ER (CABENUVA) 600 & 900 MG/3ML INJECTION    Inject 1 kit into the muscle every 2 (two) months.   CETIRIZINE (ZYRTEC) 10 MG TABLET    Take 10 mg by mouth daily.   TRIAMCINOLONE CREAM (KENALOG) 0.5 %    Apply topically 2 (two) times daily. As needed for eczema  Modified Medications   No medications on file  Discontinued Medications   No medications on file    Allergies: No Known Allergies  Past Medical History: Past Medical History:  Diagnosis Date   HIV infection ([place])     Social History: Social History   Socioeconomic History   Marital status: Single    Spouse name: Not on file   Number of children: Not on file   Years of education: Not on file   Highest education level: Not on file  Occupational History   Not on file  Tobacco Use   Smoking status: Some Days    Packs/day: 0.50    Years: 6.00    Total pack years: 3.00    Types: Cigarettes   Smokeless tobacco: Never  Substance and Sexual Activity   Alcohol use:  Yes    Alcohol/week: 0.0 standard drinks of alcohol    Comment: occasional   Drug use: Yes    Types: Marijuana   Sexual activity: Not Currently    Partners: Male, Male    Birth control/protection: Condom    Comment: refused condoms 8/1  Other Topics Concern   Not on file  Social History Narrative   Not on file   Social Determinants of Health   Financial Resource Strain: Not on file  Food Insecurity: Not on file  Transportation Needs: Not on file  Physical Activity: Not on file  Stress: Not on file  Social Connections: Not on file    Labs: Lab Results  Component Value Date   HIV1RNAQUANT NOT DETECTED 07/02/2022   HIV1RNAQUANT <20 NOT DETECTED 02/21/2019   HIV1RNAQUANT <20 NOT DETECTED 08/23/2018   CD4TABS 420 02/21/2019   CD4TABS 430 08/23/2018   CD4TABS 420 01/12/2018    RPR and STI Lab Results  Component Value Date   LABRPR NON-REACTIVE 07/02/2022   LABRPR NON-REACTIVE 08/23/2018   LABRPR NON REAC 04/07/2017   LABRPR NON REAC 05/25/2016   LABRPR NON REAC 04/09/2014    STI Results GC GC CT CT  Latest Ref Rng & Units  NEGATIVE  NEGATIVE  07/02/2022  2:11 PM Negative   Negative    08/23/2018 12:00 AM Negative   Negative    04/07/2017 12:00  AM Negative   Negative    05/25/2016 12:00 AM Negative   Negative    04/09/2014 12:00 AM NG: Negative   CT: Negative    07/14/2013  1:16 AM  POSITIVE   POSITIVE     Hepatitis B Lab Results  Component Value Date   HEPBSAB REACTIVE (A) 07/02/2022   HEPBSAG NON-REACTIVE 07/02/2022   HEPBCAB NON-REACTIVE 07/02/2022   Hepatitis C Lab Results  Component Value Date   HEPCAB NON-REACTIVE 07/02/2022   Hepatitis A Lab Results  Component Value Date   HAV REACTIVE (A) 07/02/2022   Lipids: Lab Results  Component Value Date   CHOL 196 07/02/2022   TRIG 67 07/02/2022   HDL 54 07/02/2022   CHOLHDL 3.6 07/02/2022   VLDL 12 04/07/2017   LDLCALC 126 (H) 07/02/2022    TARGET DATE: The 7th  Assessment: Nathan Maynard presents  today for his maintenance Cabenuva injections. Past injections were tolerated well without issues.  Administered cabotegravir 600mg /73mL in left upper outer quadrant of the gluteal muscle. Administered rilpivirine 900 mg/68mL in the right upper outer quadrant of the gluteal muscle. No issues with injections. He will follow up in 2 months for next set of injections.  Plan: - Cabenuva injections administered - HIV RNA today - Next injections scheduled for 10/20/22 with me and 12/21/22 with Dr. Linus Salmons  - Call with any issues or questions  Jenese Mischke L. Gray Doering, PharmD, BCIDP, AAHIVP, [place] Clinical Pharmacist Practitioner [place] for Infectious Disease

## 2022-08-20 LAB — HIV-1 RNA QUANT-NO REFLEX-BLD
HIV 1 RNA Quant: NOT DETECTED Copies/mL
HIV-1 RNA Quant, Log: NOT DETECTED Log cps/mL

## 2022-10-14 ENCOUNTER — Telehealth: Payer: Self-pay

## 2022-10-14 NOTE — Telephone Encounter (Signed)
[facility] Patient Advocate Encounter  Patient's medication Kern Reap) have been couriered to [facility] from [employer] and will be administered on the patient next office visit on 10/20/22.  Ileene Patrick , [place] [facility] Patient [facility] for Infectious Disease Phone: [phone] Fax:  [phone]

## 2022-10-19 NOTE — Progress Notes (Unsigned)
HPI: Nathan Maynard is a 31 y.o. male who presents to the [place] clinic for [place] administration.  Patient Active Problem List   Diagnosis Date Noted   Need for prophylactic vaccination against Streptococcus pneumoniae (pneumococcus) 09/06/2018   Tobacco abuse 04/28/2017   Encounter for long-term (current) use of high-risk medication 10/28/2016   Screening examination for venereal disease 10/28/2016   Tooth gap 12/04/2013   Depression 11/28/2012   Human immunodeficiency virus I infection ([place]) 04/07/2009   Eczema 04/07/2009    Patient's Medications  New Prescriptions   No medications on file  Previous Medications   BICTEGRAVIR-EMTRICITAB-TENOFOV (BIKTARVY PO)    Take by mouth.   BICTEGRAVIR-EMTRICITABINE-TENOFOVIR AF (BIKTARVY) 50-200-25 MG TABS TABLET    Take 1 tablet by mouth daily.   CABOTEGRAVIR & RILPIVIRINE ER (CABENUVA) 600 & 900 MG/3ML INJECTION    Inject 1 kit into the muscle every 30 (thirty) days.   CABOTEGRAVIR & RILPIVIRINE ER (CABENUVA) 600 & 900 MG/3ML INJECTION    Inject 1 kit into the muscle every 2 (two) months.   CETIRIZINE (ZYRTEC) 10 MG TABLET    Take 10 mg by mouth daily.   TRIAMCINOLONE CREAM (KENALOG) 0.5 %    Apply topically 2 (two) times daily. As needed for eczema  Modified Medications   No medications on file  Discontinued Medications   No medications on file    Allergies: No Known Allergies  Past Medical History: Past Medical History:  Diagnosis Date   HIV infection ([place])     Social History: Social History   Socioeconomic History   Marital status: Single    Spouse name: Not on file   Number of children: Not on file   Years of education: Not on file   Highest education level: Not on file  Occupational History   Not on file  Tobacco Use   Smoking status: Some Days    Packs/day: 0.50    Years: 6.00    Total pack years: 3.00    Types: Cigarettes   Smokeless tobacco: Never  Substance and Sexual Activity   Alcohol use:  Yes    Alcohol/week: 0.0 standard drinks of alcohol    Comment: occasional   Drug use: Yes    Types: Marijuana   Sexual activity: Not Currently    Partners: Male, Male    Birth control/protection: Condom    Comment: refused condoms 8/1  Other Topics Concern   Not on file  Social History Narrative   Not on file   Social Determinants of Health   Financial Resource Strain: Not on file  Food Insecurity: Not on file  Transportation Needs: Not on file  Physical Activity: Not on file  Stress: Not on file  Social Connections: Not on file    Labs: Lab Results  Component Value Date   HIV1RNAQUANT Not Detected 08/18/2022   HIV1RNAQUANT NOT DETECTED 07/02/2022   HIV1RNAQUANT <20 NOT DETECTED 02/21/2019   CD4TABS 420 02/21/2019   CD4TABS 430 08/23/2018   CD4TABS 420 01/12/2018    RPR and STI Lab Results  Component Value Date   LABRPR NON-REACTIVE 07/02/2022   LABRPR NON-REACTIVE 08/23/2018   LABRPR NON REAC 04/07/2017   LABRPR NON REAC 05/25/2016   LABRPR NON REAC 04/09/2014    STI Results GC GC CT CT  Latest Ref Rng & Units  NEGATIVE  NEGATIVE  07/02/2022  2:11 PM Negative   Negative    08/23/2018 12:00 AM Negative   Negative    04/07/2017 12:00 AM  Negative   Negative    05/25/2016 12:00 AM Negative   Negative    04/09/2014 12:00 AM NG: Negative   CT: Negative    07/14/2013  1:16 AM  POSITIVE   POSITIVE     Hepatitis B Lab Results  Component Value Date   HEPBSAB REACTIVE (A) 07/02/2022   HEPBSAG NON-REACTIVE 07/02/2022   HEPBCAB NON-REACTIVE 07/02/2022   Hepatitis C Lab Results  Component Value Date   HEPCAB NON-REACTIVE 07/02/2022   Hepatitis A Lab Results  Component Value Date   HAV REACTIVE (A) 07/02/2022   Lipids: Lab Results  Component Value Date   CHOL 196 07/02/2022   TRIG 67 07/02/2022   HDL 54 07/02/2022   CHOLHDL 3.6 07/02/2022   VLDL 12 04/07/2017   LDLCALC 126 (H) 07/02/2022    TARGET DATE: 7th of each month   Assessment: DJ  presents today for his maintenance Cabenuva injections. Past injections were tolerated well without issues. Patient denies any symptoms of an STI today. Last STI screening 06/2122 was negative. Patient offered condoms during today's visit and   Administered cabotegravir 610m/3mL in left upper outer quadrant of the gluteal muscle. Administered rilpivirine 900 mg/33min the right upper outer quadrant of the gluteal muscle. No issues with injections. He will follow up in 2 months for next set of injections.  Patient offered COVID, influenza and tdap vaccines during today's visit   Plan: - Cabenuva injections administered - F/U urine cytology  - Next injections scheduled for 12/21/22 with Cassie  - Call with any issues or questions  AuAdria DillPharmD PGY-2 Infectious Diseases Resident  10/19/2022 6:28 PM

## 2022-10-20 ENCOUNTER — Other Ambulatory Visit: Payer: Self-pay

## 2022-10-20 ENCOUNTER — Ambulatory Visit (INDEPENDENT_AMBULATORY_CARE_PROVIDER_SITE_OTHER): Payer: Self-pay

## 2022-10-20 ENCOUNTER — Ambulatory Visit (INDEPENDENT_AMBULATORY_CARE_PROVIDER_SITE_OTHER): Payer: Self-pay | Admitting: Pharmacist

## 2022-10-20 DIAGNOSIS — Z23 Encounter for immunization: Secondary | ICD-10-CM

## 2022-10-20 DIAGNOSIS — B20 Human immunodeficiency virus [HIV] disease: Secondary | ICD-10-CM

## 2022-10-20 DIAGNOSIS — Z113 Encounter for screening for infections with a predominantly sexual mode of transmission: Secondary | ICD-10-CM

## 2022-10-20 MED ORDER — CABOTEGRAVIR & RILPIVIRINE ER 600 & 900 MG/3ML IM SUER
1.0000 | Freq: Once | INTRAMUSCULAR | Status: AC
  Administered 2022-10-20: 1 via INTRAMUSCULAR

## 2022-10-20 NOTE — Progress Notes (Signed)
HPI: Nathan Maynard is a 31 y.o. male who presents to the [place] clinic for [place] administration.  Patient Active Problem List   Diagnosis Date Noted   Need for prophylactic vaccination against Streptococcus pneumoniae (pneumococcus) 09/06/2018   Tobacco abuse 04/28/2017   Encounter for long-term (current) use of high-risk medication 10/28/2016   Screening examination for venereal disease 10/28/2016   Tooth gap 12/04/2013   Depression 11/28/2012   Human immunodeficiency virus I infection ([place]) 04/07/2009   Eczema 04/07/2009    Patient's Medications  New Prescriptions   No medications on file  Previous Medications   BICTEGRAVIR-EMTRICITAB-TENOFOV (BIKTARVY PO)    Take by mouth.   CABOTEGRAVIR & RILPIVIRINE ER (CABENUVA) 600 & 900 MG/3ML INJECTION    Inject 1 kit into the muscle every 30 (thirty) days.   CABOTEGRAVIR & RILPIVIRINE ER (CABENUVA) 600 & 900 MG/3ML INJECTION    Inject 1 kit into the muscle every 2 (two) months.   CETIRIZINE (ZYRTEC) 10 MG TABLET    Take 10 mg by mouth daily.   TRIAMCINOLONE CREAM (KENALOG) 0.5 %    Apply topically 2 (two) times daily. As needed for eczema  Modified Medications   No medications on file  Discontinued Medications   BICTEGRAVIR-EMTRICITABINE-TENOFOVIR AF (BIKTARVY) 50-200-25 MG TABS TABLET    Take 1 tablet by mouth daily.    Allergies: No Known Allergies  Past Medical History: Past Medical History:  Diagnosis Date   HIV infection ([place])     Social History: Social History   Socioeconomic History   Marital status: Single    Spouse name: Not on file   Number of children: Not on file   Years of education: Not on file   Highest education level: Not on file  Occupational History   Not on file  Tobacco Use   Smoking status: Some Days    Packs/day: 0.50    Years: 6.00    Total pack years: 3.00    Types: Cigarettes   Smokeless tobacco: Never  Substance and Sexual Activity   Alcohol use: Yes    Alcohol/week: 0.0  standard drinks of alcohol    Comment: occasional   Drug use: Yes    Types: Marijuana   Sexual activity: Not Currently    Partners: Male, Male    Birth control/protection: Condom    Comment: refused condoms 8/1  Other Topics Concern   Not on file  Social History Narrative   Not on file   Social Determinants of Health   Financial Resource Strain: Not on file  Food Insecurity: Not on file  Transportation Needs: Not on file  Physical Activity: Not on file  Stress: Not on file  Social Connections: Not on file    Labs: Lab Results  Component Value Date   HIV1RNAQUANT Not Detected 08/18/2022   HIV1RNAQUANT NOT DETECTED 07/02/2022   HIV1RNAQUANT <20 NOT DETECTED 02/21/2019   CD4TABS 420 02/21/2019   CD4TABS 430 08/23/2018   CD4TABS 420 01/12/2018    RPR and STI Lab Results  Component Value Date   LABRPR NON-REACTIVE 07/02/2022   LABRPR NON-REACTIVE 08/23/2018   LABRPR NON REAC 04/07/2017   LABRPR NON REAC 05/25/2016   LABRPR NON REAC 04/09/2014    STI Results GC GC CT CT  Latest Ref Rng & Units  NEGATIVE  NEGATIVE  07/02/2022  2:11 PM Negative   Negative    08/23/2018 12:00 AM Negative   Negative    04/07/2017 12:00 AM Negative   Negative  05/25/2016 12:00 AM Negative   Negative    04/09/2014 12:00 AM NG: Negative   CT: Negative    07/14/2013  1:16 AM  POSITIVE   POSITIVE     Hepatitis B Lab Results  Component Value Date   HEPBSAB REACTIVE (A) 07/02/2022   HEPBSAG NON-REACTIVE 07/02/2022   HEPBCAB NON-REACTIVE 07/02/2022   Hepatitis C Lab Results  Component Value Date   HEPCAB NON-REACTIVE 07/02/2022   Hepatitis A Lab Results  Component Value Date   HAV REACTIVE (A) 07/02/2022   Lipids: Lab Results  Component Value Date   CHOL 196 07/02/2022   TRIG 67 07/02/2022   HDL 54 07/02/2022   CHOLHDL 3.6 07/02/2022   VLDL 12 04/07/2017   LDLCALC 126 (H) 07/02/2022    TARGET DATE: The 7th  Assessment: Nathan Maynard presents today for his maintenance  Cabenuva injections. Past injections were tolerated well except for mild to moderate pain on his left side for about a week after his last set of injections. Advised that the more injections he has, the less pain he will feel. Discussed flu and updated COVID vaccines today, as well as, a Tdap booster. He agrees to receive all. No new partners or any signs or symptoms of STIs today but will update screening. He has not had a rectal swab since he lived in [place] but is the receptive partner during some encounters. Will order all three sites for screening.   Administered cabotegravir 650m/3mL in left upper outer quadrant of the gluteal muscle. Administered rilpivirine 900 mg/335min the right upper outer quadrant of the gluteal muscle. No issues with injections. He will follow up in 2 months for next set of injections.  Plan: - Cabenuva injections administered - HIV RNA, RPR, and urine/rectal/pharyngeal cytologies for GC/chlamydia - Flu vaccine - COVID vaccine - Tdap vaccine - Next injections scheduled for 12/21/21 with Dr. CoLinus Salmonsnd 02/16/23 with me - Call with any issues or questions  Artemisia Auvil L. Jamaal Bernasconi, PharmD, BCIDP, AAHIVP, CPBuffalo Grovelinical Pharmacist Practitioner [place] Infectious Disease

## 2022-10-21 ENCOUNTER — Encounter: Payer: Self-pay | Admitting: Pharmacist

## 2022-10-21 LAB — CYTOLOGY, (ORAL, ANAL, URETHRAL) ANCILLARY ONLY
Chlamydia: NEGATIVE
Chlamydia: NEGATIVE
Comment: NEGATIVE
Comment: NEGATIVE
Comment: NORMAL
Comment: NORMAL
Neisseria Gonorrhea: NEGATIVE
Neisseria Gonorrhea: POSITIVE — AB

## 2022-10-21 LAB — URINE CYTOLOGY ANCILLARY ONLY
Chlamydia: NEGATIVE
Comment: NEGATIVE
Comment: NORMAL
Neisseria Gonorrhea: NEGATIVE

## 2022-10-22 ENCOUNTER — Other Ambulatory Visit: Payer: Self-pay | Admitting: Internal Medicine

## 2022-10-22 ENCOUNTER — Ambulatory Visit (INDEPENDENT_AMBULATORY_CARE_PROVIDER_SITE_OTHER): Payer: Self-pay

## 2022-10-22 ENCOUNTER — Other Ambulatory Visit: Payer: Self-pay

## 2022-10-22 DIAGNOSIS — A549 Gonococcal infection, unspecified: Secondary | ICD-10-CM

## 2022-10-22 MED ORDER — CEFTRIAXONE SODIUM 500 MG IJ SOLR
500.0000 mg | Freq: Once | INTRAMUSCULAR | Status: AC
  Administered 2022-10-22: 500 mg via INTRAMUSCULAR

## 2022-10-23 LAB — HIV-1 RNA QUANT-NO REFLEX-BLD
HIV 1 RNA Quant: NOT DETECTED Copies/mL
HIV-1 RNA Quant, Log: NOT DETECTED Log cps/mL

## 2022-10-23 LAB — RPR: RPR Ser Ql: NONREACTIVE

## 2022-11-15 ENCOUNTER — Other Ambulatory Visit: Payer: Self-pay

## 2022-11-15 ENCOUNTER — Ambulatory Visit: Payer: Self-pay | Admitting: Physician Assistant

## 2022-11-15 ENCOUNTER — Encounter: Payer: Self-pay | Admitting: Physician Assistant

## 2022-11-15 VITALS — BP 123/82 | HR 69 | Temp 98.1°F | Resp 16 | Wt 181.0 lb

## 2022-11-15 DIAGNOSIS — Z113 Encounter for screening for infections with a predominantly sexual mode of transmission: Secondary | ICD-10-CM

## 2022-11-15 NOTE — Patient Instructions (Addendum)
Nice meeting you! Try latex free condoms provided today in clinic I will contact you via MyChart with results as soon as they arrive

## 2022-11-15 NOTE — Progress Notes (Signed)
Subjective:    Patient ID: Nathan Maynard, male    DOB: 1991-11-09, 31 y.o.   MRN: [id number]  Chief Complaint  Patient presents with   Follow-up    gonorrhea     HPI:  Nathan Maynard is a 31 y.o. male with well controlled HIV-1, presenting today for gonorrhea confirmation of resolution. He was diagnosed with Gonorrhea 10/21/2022, received Rocephin 500 mg IM 10/22/22. He avoided sexual contact for 10 days following treatment and advised his partner to seek treatment. He is sexually active with one male partner engaging in condomless insertive and penetrative anal sex and oral sex.  He believes he has an allergy to latex due to "itching" following use of condoms. Both partners are exclusive with one another. He is asymptomatic today, but wants to confirm resolution or reinfection due to partner. He has previous hx of STI's.     Allergies  Allergen Reactions   Latex Itching      Outpatient Medications Prior to Visit  Medication Sig Dispense Refill   Bictegravir-Emtricitab-Tenofov (BIKTARVY PO) Take by mouth.     cabotegravir & rilpivirine ER (CABENUVA) 600 & 900 MG/3ML injection Inject 1 kit into the muscle every 30 (thirty) days. 6 mL 1   cabotegravir & rilpivirine ER (CABENUVA) 600 & 900 MG/3ML injection Inject 1 kit into the muscle every 2 (two) months. 6 mL 5   cetirizine (ZYRTEC) 10 MG tablet Take 10 mg by mouth daily.     triamcinolone cream (KENALOG) 0.5 % Apply topically 2 (two) times daily. As needed for eczema 30 g 0   No facility-administered medications prior to visit.     Past Medical History:  Diagnosis Date   HIV infection ([place])      History reviewed. No pertinent surgical history.     Review of Systems  Constitutional: Negative.  Negative for appetite change, chills and fever.  HENT: Negative.    Genitourinary:  Negative for decreased urine volume, difficulty urinating, dysuria, genital sores, penile discharge, penile pain, penile swelling, scrotal  swelling, testicular pain and urgency.  Musculoskeletal: Negative.   Allergic/Immunologic: Negative for immunocompromised state.  Neurological:  Negative for dizziness, weakness and headaches.  Hematological:  Negative for adenopathy.  Psychiatric/Behavioral:  Negative for behavioral problems.       Objective:    BP 123/82   Pulse 69   Temp 98.1 F (36.7 C)   Resp 16   Wt 181 lb (82.1 kg)   SpO2 100%   BMI 28.35 kg/m  Nursing note and vital signs reviewed.  Physical Exam Vitals reviewed.  Constitutional:      Appearance: Normal appearance. He is normal weight.  HENT:     Head: Normocephalic and atraumatic.     Mouth/Throat:     Mouth: Mucous membranes are dry.     Pharynx: Oropharynx is clear.  Eyes:     Extraocular Movements: Extraocular movements intact.     Pupils: Pupils are equal, round, and reactive to light.  Cardiovascular:     Rate and Rhythm: Normal rate and regular rhythm.  Pulmonary:     Effort: Pulmonary effort is normal.     Breath sounds: Normal breath sounds.  Skin:    General: Skin is warm and dry.  Neurological:     General: No focal deficit present.     Mental Status: He is alert and oriented to person, place, and time.  Psychiatric:        Mood and Affect: Mood normal.  Behavior: Behavior normal.        Thought Content: Thought content normal.        Judgment: Judgment normal.         07/13/2022    9:48 AM 09/06/2018    9:36 AM 01/26/2018   10:32 AM 10/28/2016   11:08 AM 06/24/2016   10:01 AM  Depression screen PHQ 2/9  Decreased Interest 0 0 0 0 0  Down, Depressed, Hopeless 0 0 0 0 0  PHQ - 2 Score 0 0 0 0 0       Assessment & Plan:  Provided latex free condoms today in clinic Discussed harm reduction strategies GC/C all 3 sites, will mychart results Follow up as needed with Dr. Linus Salmons  Patient Active Problem List   Diagnosis Date Noted   Need for prophylactic vaccination against Streptococcus pneumoniae (pneumococcus)  09/06/2018   Tobacco abuse 04/28/2017   Encounter for long-term (current) use of high-risk medication 10/28/2016   Screening examination for venereal disease 10/28/2016   Tooth gap 12/04/2013   Depression 11/28/2012   Human immunodeficiency virus I infection ([place]) 04/07/2009   Eczema 04/07/2009     Problem List Items Addressed This Visit   None Visit Diagnoses     Routine screening for STI (sexually transmitted infection)    -  Primary   Relevant Orders   CT/NG RNA, TMA Rectal   GC/CT Probe, Amp (Throat)   C. trachomatis/N. gonorrhoeae RNA        I am having Nathan Lilia Pro "Dj" maintain his cetirizine, triamcinolone cream, Bictegravir-Emtricitab-Tenofov (BIKTARVY PO), cabotegravir & rilpivirine ER, and cabotegravir & rilpivirine ER.   No orders of the defined types were placed in this encounter.    Follow-up: Return if symptoms worsen or fail to improve.

## 2022-11-16 LAB — GC/CHLAMYDIA PROBE, AMP (THROAT)
Chlamydia trachomatis RNA: NOT DETECTED
Neisseria gonorrhoeae RNA: NOT DETECTED

## 2022-11-16 LAB — C. TRACHOMATIS/N. GONORRHOEAE RNA
C. trachomatis RNA, TMA: NOT DETECTED
N. gonorrhoeae RNA, TMA: NOT DETECTED

## 2022-11-16 LAB — CT/NG RNA, TMA RECTAL
Chlamydia Trachomatis RNA: NOT DETECTED
Neisseria Gonorrhoeae RNA: NOT DETECTED

## 2022-12-09 ENCOUNTER — Other Ambulatory Visit: Payer: Self-pay

## 2022-12-15 ENCOUNTER — Telehealth: Payer: Self-pay

## 2022-12-15 NOTE — Telephone Encounter (Signed)
[facility] Patient Advocate Encounter  Patient's medications have been couriered to [facility] from [place]: [phone], and will be administered on  12/21/2022.

## 2022-12-21 ENCOUNTER — Other Ambulatory Visit: Payer: Self-pay

## 2022-12-21 ENCOUNTER — Encounter: Payer: Self-pay | Admitting: Internal Medicine

## 2022-12-21 ENCOUNTER — Ambulatory Visit: Payer: Self-pay | Admitting: Internal Medicine

## 2022-12-21 VITALS — BP 118/73 | HR 82 | Resp 15 | Ht 67.0 in | Wt 179.0 lb

## 2022-12-21 DIAGNOSIS — B20 Human immunodeficiency virus [HIV] disease: Secondary | ICD-10-CM

## 2022-12-21 DIAGNOSIS — Z720 Tobacco use: Secondary | ICD-10-CM

## 2022-12-21 DIAGNOSIS — Z113 Encounter for screening for infections with a predominantly sexual mode of transmission: Secondary | ICD-10-CM

## 2022-12-21 DIAGNOSIS — F17210 Nicotine dependence, cigarettes, uncomplicated: Secondary | ICD-10-CM

## 2022-12-21 MED ORDER — CABOTEGRAVIR & RILPIVIRINE ER 600 & 900 MG/3ML IM SUER
1.0000 | Freq: Once | INTRAMUSCULAR | Status: AC
  Administered 2022-12-21: 1 via INTRAMUSCULAR

## 2022-12-21 NOTE — Assessment & Plan Note (Signed)
He is doing great with no concerns    No changes indicated and will continue with Cabenuva Follow up in 2 months with pharmacy

## 2022-12-21 NOTE — Assessment & Plan Note (Signed)
Will screen.  He is no longer with his previous partner.

## 2022-12-21 NOTE — Progress Notes (Signed)
   Subjective:    Patient ID: Nathan Maynard, male    DOB: Mar 19, 1991, 32 y.o.   MRN: [id number]  HPI DJ is here for follow up of HIV He continues on [place] and please with it.  Feels well though does note some pain at the injection site on injection days.  Much prefers the injections though and motivated to continue.  He continues to smoke.    Review of Systems  Constitutional:  Negative for fatigue.  Gastrointestinal:  Negative for diarrhea.  Skin:  Negative for rash.       Objective:   Physical Exam Eyes:     General: No scleral icterus. Pulmonary:     Effort: Pulmonary effort is normal.  Skin:    Findings: No rash.  Neurological:     General: No focal deficit present.     Mental Status: He is alert.    SH: + tobacco       Assessment & Plan:

## 2022-12-21 NOTE — Assessment & Plan Note (Signed)
Discussed cessation and information for the [place] quit line provided

## 2022-12-22 LAB — CT/NG RNA, TMA RECTAL
Chlamydia Trachomatis RNA: NOT DETECTED
Neisseria Gonorrhoeae RNA: NOT DETECTED

## 2022-12-22 LAB — GC/CHLAMYDIA PROBE, AMP (THROAT)
Chlamydia trachomatis RNA: NOT DETECTED
Neisseria gonorrhoeae RNA: NOT DETECTED

## 2022-12-22 LAB — T-HELPER CELLS (CD4) COUNT (NOT AT ARMC)
Absolute CD4: 737 cells/uL (ref 490–1740)
CD4 T Helper %: 29 % — ABNORMAL LOW (ref 30–61)
Total lymphocyte count: 2568 cells/uL (ref 850–3900)

## 2022-12-22 LAB — C. TRACHOMATIS/N. GONORRHOEAE RNA
C. trachomatis RNA, TMA: NOT DETECTED
N. gonorrhoeae RNA, TMA: NOT DETECTED

## 2022-12-23 LAB — HIV-1 RNA QUANT-NO REFLEX-BLD
HIV 1 RNA Quant: NOT DETECTED Copies/mL
HIV-1 RNA Quant, Log: NOT DETECTED Log cps/mL

## 2022-12-23 LAB — RPR: RPR Ser Ql: NONREACTIVE

## 2023-01-04 ENCOUNTER — Other Ambulatory Visit: Payer: Self-pay

## 2023-01-18 ENCOUNTER — Ambulatory Visit: Payer: Self-pay | Admitting: Internal Medicine

## 2023-02-09 ENCOUNTER — Telehealth: Payer: Self-pay | Admitting: Pharmacist

## 2023-02-09 NOTE — Telephone Encounter (Signed)
Patient's specialty medication Kern Reap) was delivered from 3/6 and will be administered at next office visit on 3/6.  Alfonse Spruce, PharmD, CPP, BCIDP, [place] Clinical Pharmacist Practitioner Infectious [place] for Infectious Disease

## 2023-02-16 ENCOUNTER — Ambulatory Visit (INDEPENDENT_AMBULATORY_CARE_PROVIDER_SITE_OTHER): Payer: Self-pay | Admitting: Pharmacist

## 2023-02-16 ENCOUNTER — Other Ambulatory Visit: Payer: Self-pay

## 2023-02-16 DIAGNOSIS — Z113 Encounter for screening for infections with a predominantly sexual mode of transmission: Secondary | ICD-10-CM

## 2023-02-16 DIAGNOSIS — B20 Human immunodeficiency virus [HIV] disease: Secondary | ICD-10-CM

## 2023-02-16 MED ORDER — CABOTEGRAVIR & RILPIVIRINE ER 600 & 900 MG/3ML IM SUER
1.0000 | Freq: Once | INTRAMUSCULAR | Status: AC
  Administered 2023-02-16: 1 via INTRAMUSCULAR

## 2023-02-16 NOTE — Progress Notes (Signed)
HPI: Nathan Maynard is a 32 y.o. male who presents to the [place] clinic for [place] administration.  Patient Active Problem List   Diagnosis Date Noted   Tobacco abuse 04/28/2017   Encounter for long-term (current) use of high-risk medication 10/28/2016   Screening examination for venereal disease 10/28/2016   Tooth gap 12/04/2013   Depression 11/28/2012   Human immunodeficiency virus I infection ([place]) 04/07/2009   Eczema 04/07/2009    Patient's Medications  New Prescriptions   No medications on file  Previous Medications   BICTEGRAVIR-EMTRICITAB-TENOFOV (BIKTARVY PO)    Take by mouth.   CABOTEGRAVIR & RILPIVIRINE ER (CABENUVA) 600 & 900 MG/3ML INJECTION    Inject 1 kit into the muscle every 30 (thirty) days.   CABOTEGRAVIR & RILPIVIRINE ER (CABENUVA) 600 & 900 MG/3ML INJECTION    Inject 1 kit into the muscle every 2 (two) months.   CETIRIZINE (ZYRTEC) 10 MG TABLET    Take 10 mg by mouth daily.   TRIAMCINOLONE CREAM (KENALOG) 0.5 %    Apply topically 2 (two) times daily. As needed for eczema  Modified Medications   No medications on file  Discontinued Medications   No medications on file    Allergies: Allergies  Allergen Reactions   Latex Itching    Past Medical History: Past Medical History:  Diagnosis Date   HIV infection ([place])     Social History: Social History   Socioeconomic History   Marital status: Single    Spouse name: Not on file   Number of children: Not on file   Years of education: Not on file   Highest education level: Not on file  Occupational History   Not on file  Tobacco Use   Smoking status: Some Days    Packs/day: 0.50    Years: 6.00    Total pack years: 3.00    Types: Cigarettes   Smokeless tobacco: Never  Substance and Sexual Activity   Alcohol use: Yes    Alcohol/week: 0.0 standard drinks of alcohol    Comment: occasional   Drug use: Yes    Types: Marijuana   Sexual activity: Not Currently    Partners: Male, Male     Birth control/protection: Condom    Comment: refused condoms 8/1  Other Topics Concern   Not on file  Social History Narrative   Not on file   Social Determinants of Health   Financial Resource Strain: Not on file  Food Insecurity: Not on file  Transportation Needs: Not on file  Physical Activity: Not on file  Stress: Not on file  Social Connections: Not on file    Labs: Lab Results  Component Value Date   HIV1RNAQUANT Not Detected 12/21/2022   HIV1RNAQUANT Not Detected 10/20/2022   HIV1RNAQUANT Not Detected 08/18/2022   CD4TABS 420 02/21/2019   CD4TABS 430 08/23/2018   CD4TABS 420 01/12/2018    RPR and STI Lab Results  Component Value Date   LABRPR NON-REACTIVE 12/21/2022   LABRPR NON-REACTIVE 10/20/2022   LABRPR NON-REACTIVE 07/02/2022   LABRPR NON-REACTIVE 08/23/2018   LABRPR NON REAC 04/07/2017    STI Results GC GC CT CT  Latest Ref Rng & Units  NEGATIVE  NEGATIVE  10/20/2022 11:18 AM Negative   Negative    10/20/2022 11:17 AM Positive    Negative   Negative    Negative    07/02/2022  2:11 PM Negative   Negative    08/23/2018 12:00 AM Negative   Negative  04/07/2017 12:00 AM Negative   Negative    05/25/2016 12:00 AM Negative   Negative    04/09/2014 12:00 AM NG: Negative   CT: Negative    07/14/2013  1:16 AM  POSITIVE   POSITIVE     Hepatitis B Lab Results  Component Value Date   HEPBSAB REACTIVE (A) 07/02/2022   HEPBSAG NON-REACTIVE 07/02/2022   HEPBCAB NON-REACTIVE 07/02/2022   Hepatitis C Lab Results  Component Value Date   HEPCAB NON-REACTIVE 07/02/2022   Hepatitis A Lab Results  Component Value Date   HAV REACTIVE (A) 07/02/2022   Lipids: Lab Results  Component Value Date   CHOL 196 07/02/2022   TRIG 67 07/02/2022   HDL 54 07/02/2022   CHOLHDL 3.6 07/02/2022   VLDL 12 04/07/2017   LDLCALC 126 (H) 07/02/2022    TARGET DATE: The 7th   Assessment: DJ presents today for his maintenance Cabenuva injections. Past injections  were tolerated well without issues. Patient has had new sexual partners since last visit and wished to receive 3-site testing for STIs today however, was unable to provide a urinary sample--oral and rectal cytologies were collected. Patient clarified that he has received 2 doses of Monkeypox with first dose being given in [place] and second dose being given 07/13/22 at [facility]. Patient has been on [place] since 07/19/22. Given HIV RNA labs consistently undetectable since 06/2022, deferred HIV RNA testing to next appointment in May.   Administered cabotegravir '600mg'$ /30m in left upper outer quadrant of the gluteal muscle. Administered rilpivirine 900 mg/357min the right upper outer quadrant of the gluteal muscle. No issues with injections. He will follow up in 2 months for next set of injections.  Plan: - Cabenuva injections administered - F/u oral and rectal cytologies  - Next injections scheduled for 04/20/23 - Call with any issues or questions   SyBilley GoslingPharmD PGY1 Pharmacy Resident 3/6/202411:32 AM

## 2023-02-17 LAB — CYTOLOGY, (ORAL, ANAL, URETHRAL) ANCILLARY ONLY
Chlamydia: NEGATIVE
Chlamydia: NEGATIVE
Comment: NEGATIVE
Comment: NEGATIVE
Comment: NORMAL
Comment: NORMAL
Neisseria Gonorrhea: NEGATIVE
Neisseria Gonorrhea: NEGATIVE

## 2023-04-12 ENCOUNTER — Telehealth: Payer: Self-pay

## 2023-04-12 NOTE — Telephone Encounter (Signed)
[facility] Patient Advocate Encounter  Patient's medication Renaldo Harrison) have been couriered to [facility] from [employer] and will be administered on the patient next office visit on 04/20/23.  [name] , CPhT Specialty Pharmacy Patient [facility] for Infectious Disease Phone: [phone] Fax:  [phone]

## 2023-04-19 NOTE — Progress Notes (Unsigned)
HPI: Nathan Maynard is a 32 y.o. male who presents to the [facility] pharmacy clinic for [place] administration.  Patient Active Problem List   Diagnosis Date Noted   Tobacco abuse 04/28/2017   Encounter for long-term (current) use of high-risk medication 10/28/2016   Screening examination for venereal disease 10/28/2016   Tooth gap 12/04/2013   Depression 11/28/2012   Human immunodeficiency virus I infection (HCC) 04/07/2009   Eczema 04/07/2009    Patient's Medications  New Prescriptions   No medications on file  Previous Medications   BICTEGRAVIR-EMTRICITAB-TENOFOV (BIKTARVY PO)    Take by mouth.   CABOTEGRAVIR & RILPIVIRINE ER (CABENUVA) 600 & 900 MG/3ML INJECTION    Inject 1 kit into the muscle every 30 (thirty) days.   CABOTEGRAVIR & RILPIVIRINE ER (CABENUVA) 600 & 900 MG/3ML INJECTION    Inject 1 kit into the muscle every 2 (two) months.   CETIRIZINE (ZYRTEC) 10 MG TABLET    Take 10 mg by mouth daily.   TRIAMCINOLONE CREAM (KENALOG) 0.5 %    Apply topically 2 (two) times daily. As needed for eczema  Modified Medications   No medications on file  Discontinued Medications   No medications on file    Allergies: Allergies  Allergen Reactions   Latex Itching    Past Medical History: Past Medical History:  Diagnosis Date   HIV infection (HCC)     Social History: Social History   Socioeconomic History   Marital status: Single    Spouse name: Not on file   Number of children: Not on file   Years of education: Not on file   Highest education level: Not on file  Occupational History   Not on file  Tobacco Use   Smoking status: Some Days    Packs/day: 0.50    Years: 6.00    Additional pack years: 0.00    Total pack years: 3.00    Types: Cigarettes   Smokeless tobacco: Never  Substance and Sexual Activity   Alcohol use: Yes    Alcohol/week: 0.0 standard drinks of alcohol    Comment: occasional   Drug use: Yes    Types: Marijuana   Sexual activity: Not  Currently    Partners: Male, Male    Birth control/protection: Condom    Comment: refused condoms 8/1  Other Topics Concern   Not on file  Social History Narrative   Not on file   Social Determinants of Health   Financial Resource Strain: Not on file  Food Insecurity: Not on file  Transportation Needs: Not on file  Physical Activity: Not on file  Stress: Not on file  Social Connections: Not on file    Labs: Lab Results  Component Value Date   HIV1RNAQUANT Not Detected 12/21/2022   HIV1RNAQUANT Not Detected 10/20/2022   HIV1RNAQUANT Not Detected 08/18/2022   CD4TABS 420 02/21/2019   CD4TABS 430 08/23/2018   CD4TABS 420 01/12/2018    RPR and STI Lab Results  Component Value Date   LABRPR NON-REACTIVE 12/21/2022   LABRPR NON-REACTIVE 10/20/2022   LABRPR NON-REACTIVE 07/02/2022   LABRPR NON-REACTIVE 08/23/2018   LABRPR NON REAC 04/07/2017    STI Results GC GC CT CT  Latest Ref Rng & Units  NEGATIVE  NEGATIVE  02/16/2023 10:31 AM Negative    Negative   Negative    Negative    10/20/2022 11:18 AM Negative   Negative    10/20/2022 11:17 AM Positive    Negative   Negative  Negative    07/02/2022  2:11 PM Negative   Negative    08/23/2018 12:00 AM Negative   Negative    04/07/2017 12:00 AM Negative   Negative    05/25/2016 12:00 AM Negative   Negative    04/09/2014 12:00 AM NG: Negative   CT: Negative    07/14/2013  1:16 AM  POSITIVE   POSITIVE     Hepatitis B Lab Results  Component Value Date   HEPBSAB REACTIVE (A) 07/02/2022   HEPBSAG NON-REACTIVE 07/02/2022   HEPBCAB NON-REACTIVE 07/02/2022   Hepatitis C Lab Results  Component Value Date   HEPCAB NON-REACTIVE 07/02/2022   Hepatitis A Lab Results  Component Value Date   HAV REACTIVE (A) 07/02/2022   Lipids: Lab Results  Component Value Date   CHOL 196 07/02/2022   TRIG 67 07/02/2022   HDL 54 07/02/2022   CHOLHDL 3.6 07/02/2022   VLDL 12 04/07/2017   LDLCALC 126 (H) 07/02/2022    TARGET  DATE: the 7th  Assessment: Sukhdeep presents today for his maintenance Cabenuva injections. Past injections were tolerated well without issues.  Administered cabotegravir 600mg /50mL in left upper outer quadrant of the gluteal muscle. Administered rilpivirine 900 mg/59mL in the right upper outer quadrant of the gluteal muscle. No issues with injections. *** will follow up in 2 months for next set of injections.  Plan: - Cabenuva injections administered - Next injections scheduled for *** - Call with any issues or questions  Irish Elders, PharmD PGY-1 [facility] Pharmacy Resident

## 2023-04-20 ENCOUNTER — Other Ambulatory Visit: Payer: Self-pay

## 2023-04-20 ENCOUNTER — Ambulatory Visit (INDEPENDENT_AMBULATORY_CARE_PROVIDER_SITE_OTHER): Payer: Self-pay | Admitting: Pharmacist

## 2023-04-20 DIAGNOSIS — B20 Human immunodeficiency virus [HIV] disease: Secondary | ICD-10-CM

## 2023-04-20 DIAGNOSIS — L309 Dermatitis, unspecified: Secondary | ICD-10-CM

## 2023-04-20 DIAGNOSIS — Z113 Encounter for screening for infections with a predominantly sexual mode of transmission: Secondary | ICD-10-CM

## 2023-04-20 MED ORDER — CABOTEGRAVIR & RILPIVIRINE ER 600 & 900 MG/3ML IM SUER
1.0000 | Freq: Once | INTRAMUSCULAR | Status: AC
Start: 2023-04-20 — End: 2023-04-20
  Administered 2023-04-20: 1 via INTRAMUSCULAR

## 2023-04-20 MED ORDER — TRIAMCINOLONE ACETONIDE 0.5 % EX CREA
TOPICAL_CREAM | Freq: Two times a day (BID) | CUTANEOUS | 0 refills | Status: DC
Start: 2023-04-20 — End: 2023-04-20

## 2023-04-20 MED ORDER — TRIAMCINOLONE ACETONIDE 0.5 % EX CREA
TOPICAL_CREAM | Freq: Two times a day (BID) | CUTANEOUS | 0 refills | Status: DC
Start: 2023-04-20 — End: 2023-08-17

## 2023-06-09 ENCOUNTER — Telehealth: Payer: Self-pay

## 2023-06-09 NOTE — Telephone Encounter (Signed)
[facility] Patient Advocate Encounter  Patient's medication Renaldo Harrison) have been couriered to [facility] from [employer] and will be administered on the patient next office visit on 06/15/23.  [name] , CPhT Specialty Pharmacy Patient [facility] for Infectious Disease Phone: [phone] Fax:  [phone]

## 2023-06-15 ENCOUNTER — Other Ambulatory Visit: Payer: Self-pay

## 2023-06-15 ENCOUNTER — Ambulatory Visit (INDEPENDENT_AMBULATORY_CARE_PROVIDER_SITE_OTHER): Payer: Self-pay | Admitting: Pharmacist

## 2023-06-15 DIAGNOSIS — B20 Human immunodeficiency virus [HIV] disease: Secondary | ICD-10-CM

## 2023-06-15 DIAGNOSIS — Z79899 Other long term (current) drug therapy: Secondary | ICD-10-CM

## 2023-06-15 DIAGNOSIS — Z113 Encounter for screening for infections with a predominantly sexual mode of transmission: Secondary | ICD-10-CM

## 2023-06-15 LAB — CBC WITH DIFFERENTIAL/PLATELET
Eosinophils Absolute: 150 cells/uL (ref 15–500)
Lymphs Abs: 2781 cells/uL (ref 850–3900)
MPV: 9.3 fL (ref 7.5–12.5)
Neutro Abs: 5078 cells/uL (ref 1500–7800)
RDW: 12.9 % (ref 11.0–15.0)

## 2023-06-15 MED ORDER — CABOTEGRAVIR & RILPIVIRINE ER 600 & 900 MG/3ML IM SUER
1.0000 | Freq: Once | INTRAMUSCULAR | Status: AC
Start: 2023-06-15 — End: 2023-06-15
  Administered 2023-06-15: 1 via INTRAMUSCULAR

## 2023-06-15 NOTE — Progress Notes (Signed)
HPI: Randol Gorden is a 32 y.o. male who presents to the [facility] pharmacy clinic for [place] administration.  Patient Active Problem List   Diagnosis Date Noted   Tobacco abuse 04/28/2017   Encounter for long-term (current) use of high-risk medication 10/28/2016   Screening examination for venereal disease 10/28/2016   Tooth gap 12/04/2013   Depression 11/28/2012   Human immunodeficiency virus I infection (HCC) 04/07/2009   Eczema 04/07/2009    Patient's Medications  New Prescriptions   No medications on file  Previous Medications   CABOTEGRAVIR & RILPIVIRINE ER (CABENUVA) 600 & 900 MG/3ML INJECTION    Inject 1 kit into the muscle every 30 (thirty) days.   CABOTEGRAVIR & RILPIVIRINE ER (CABENUVA) 600 & 900 MG/3ML INJECTION    Inject 1 kit into the muscle every 2 (two) months.   CETIRIZINE (ZYRTEC) 10 MG TABLET    Take 10 mg by mouth daily.   TRIAMCINOLONE CREAM (KENALOG) 0.5 %    Apply topically 2 (two) times daily. As needed for eczema  Modified Medications   No medications on file  Discontinued Medications   No medications on file    Allergies: Allergies  Allergen Reactions   Latex Itching    Past Medical History: Past Medical History:  Diagnosis Date   HIV infection (HCC)     Social History: Social History   Socioeconomic History   Marital status: Single    Spouse name: Not on file   Number of children: Not on file   Years of education: Not on file   Highest education level: Not on file  Occupational History   Not on file  Tobacco Use   Smoking status: Some Days    Packs/day: 0.50    Years: 6.00    Additional pack years: 0.00    Total pack years: 3.00    Types: Cigarettes   Smokeless tobacco: Never  Substance and Sexual Activity   Alcohol use: Yes    Alcohol/week: 0.0 standard drinks of alcohol    Comment: occasional   Drug use: Yes    Types: Marijuana   Sexual activity: Not Currently    Partners: Male, Male    Birth control/protection:  Condom    Comment: refused condoms 8/1  Other Topics Concern   Not on file  Social History Narrative   Not on file   Social Determinants of Health   Financial Resource Strain: Not on file  Food Insecurity: Not on file  Transportation Needs: Not on file  Physical Activity: Not on file  Stress: Not on file  Social Connections: Not on file    Labs: Lab Results  Component Value Date   HIV1RNAQUANT Not Detected 12/21/2022   HIV1RNAQUANT Not Detected 10/20/2022   HIV1RNAQUANT Not Detected 08/18/2022   CD4TABS 420 02/21/2019   CD4TABS 430 08/23/2018   CD4TABS 420 01/12/2018    RPR and STI Lab Results  Component Value Date   LABRPR NON-REACTIVE 12/21/2022   LABRPR NON-REACTIVE 10/20/2022   LABRPR NON-REACTIVE 07/02/2022   LABRPR NON-REACTIVE 08/23/2018   LABRPR NON REAC 04/07/2017    STI Results GC GC CT CT  Latest Ref Rng & Units  NEGATIVE  NEGATIVE  02/16/2023 10:31 AM Negative    Negative   Negative    Negative    10/20/2022 11:18 AM Negative   Negative    10/20/2022 11:17 AM Positive    Negative   Negative    Negative    07/02/2022  2:11 PM Negative  Negative    08/23/2018 12:00 AM Negative   Negative    04/07/2017 12:00 AM Negative   Negative    05/25/2016 12:00 AM Negative   Negative    04/09/2014 12:00 AM NG: Negative   CT: Negative    07/14/2013  1:16 AM  POSITIVE   POSITIVE     Hepatitis B Lab Results  Component Value Date   HEPBSAB REACTIVE (A) 07/02/2022   HEPBSAG NON-REACTIVE 07/02/2022   HEPBCAB NON-REACTIVE 07/02/2022   Hepatitis C Lab Results  Component Value Date   HEPCAB NON-REACTIVE 07/02/2022   Hepatitis A Lab Results  Component Value Date   HAV REACTIVE (A) 07/02/2022   Lipids: Lab Results  Component Value Date   CHOL 196 07/02/2022   TRIG 67 07/02/2022   HDL 54 07/02/2022   CHOLHDL 3.6 07/02/2022   VLDL 12 04/07/2017   LDLCALC 126 (H) 07/02/2022    TARGET DATE: The 7th  Assessment: DJ presents today for his maintenance  Cabenuva injections. Past injections were tolerated well without issues. Last HIV RNA was undetectable in January. Will update lab work today. Declines STI testing.  Administered cabotegravir 600mg /91mL in left upper outer quadrant of the gluteal muscle. Administered rilpivirine 900 mg/49mL in the right upper outer quadrant of the gluteal muscle. No issues with injections. He will follow up in 2 months for next set of injections.  Plan: - Cabenuva injections administered - HIV RNA, CD4, CBC, CMP, lipid panel, and RPR today - Next injections scheduled for 08/17/23 with Dr. Luciana Axe and 10/17/23 with me - Call with any issues or questions  Jacson Rapaport L. Louie Meaders, PharmD, BCIDP, AAHIVP, CPP Clinical Pharmacist Practitioner Infectious Diseases Clinical Pharmacist [facility] for Infectious [facility]

## 2023-06-16 LAB — COMPREHENSIVE METABOLIC PANEL
Albumin: 4.5 g/dL (ref 3.6–5.1)
Alkaline phosphatase (APISO): 66 U/L (ref 36–130)
BUN: 13 mg/dL (ref 7–25)
CO2: 30 mmol/L (ref 20–32)
Creat: 1.25 mg/dL (ref 0.60–1.26)
Glucose, Bld: 88 mg/dL (ref 65–99)
Sodium: 138 mmol/L (ref 135–146)
Total Bilirubin: 0.6 mg/dL (ref 0.2–1.2)

## 2023-06-16 LAB — LIPID PANEL: Non-HDL Cholesterol (Calc): 154 mg/dL (calc) — ABNORMAL HIGH (ref <130)

## 2023-06-16 LAB — T-HELPER CELLS (CD4) COUNT (NOT AT ARMC): Total lymphocyte count: 2656 cells/uL (ref 850–3900)

## 2023-06-17 LAB — LIPID PANEL
Cholesterol: 210 mg/dL — ABNORMAL HIGH (ref <200)
HDL: 56 mg/dL (ref >=40)
LDL Cholesterol (Calc): 136 mg/dL (calc) — ABNORMAL HIGH
Total CHOL/HDL Ratio: 3.8 (calc) (ref <5.0)
Triglycerides: 82 mg/dL (ref <150)

## 2023-06-17 LAB — COMPREHENSIVE METABOLIC PANEL
AG Ratio: 1.7 (calc) (ref 1.0–2.5)
ALT: 14 U/L (ref 9–46)
AST: 15 U/L (ref 10–40)
Calcium: 9.9 mg/dL (ref 8.6–10.3)
Chloride: 103 mmol/L (ref 98–110)
Globulin: 2.7 g/dL (calc) (ref 1.9–3.7)
Potassium: 4 mmol/L (ref 3.5–5.3)
Total Protein: 7.2 g/dL (ref 6.1–8.1)

## 2023-06-17 LAB — CBC WITH DIFFERENTIAL/PLATELET
Absolute Monocytes: 739 cells/uL (ref 200–950)
Basophils Absolute: 53 cells/uL (ref 0–200)
Basophils Relative: 0.6 %
Eosinophils Relative: 1.7 %
HCT: 43.9 % (ref 38.5–50.0)
Hemoglobin: 14.9 g/dL (ref 13.2–17.1)
MCH: 31 pg (ref 27.0–33.0)
MCHC: 33.9 g/dL (ref 32.0–36.0)
MCV: 91.3 fL (ref 80.0–100.0)
Monocytes Relative: 8.4 %
Neutrophils Relative %: 57.7 %
Platelets: 307 10*3/uL (ref 140–400)
RBC: 4.81 10*6/uL (ref 4.20–5.80)
Total Lymphocyte: 31.6 %
WBC: 8.8 10*3/uL (ref 3.8–10.8)

## 2023-06-17 LAB — RPR: RPR Ser Ql: NONREACTIVE

## 2023-06-17 LAB — HIV-1 RNA QUANT-NO REFLEX-BLD
HIV 1 RNA Quant: NOT DETECTED Copies/mL
HIV-1 RNA Quant, Log: NOT DETECTED Log cps/mL

## 2023-06-17 LAB — T-HELPER CELLS (CD4) COUNT (NOT AT ARMC)
Absolute CD4: 824 cells/uL (ref 490–1740)
CD4 T Helper %: 31 % (ref 30–61)

## 2023-08-03 ENCOUNTER — Other Ambulatory Visit: Payer: Self-pay | Admitting: Pharmacist

## 2023-08-03 DIAGNOSIS — B20 Human immunodeficiency virus [HIV] disease: Secondary | ICD-10-CM

## 2023-08-09 ENCOUNTER — Telehealth: Payer: Self-pay

## 2023-08-09 NOTE — Telephone Encounter (Addendum)
[facility] Patient Advocate Encounter  Patient's medication Nathan Maynard) have been couriered to [facility] from [employer] and will be administered on the patient next office visit on 08/17/23.  [name] , CPhT Specialty Pharmacy Patient [facility] for Infectious Disease Phone: [phone] Fax:  [phone]

## 2023-08-17 ENCOUNTER — Other Ambulatory Visit: Payer: Self-pay

## 2023-08-17 ENCOUNTER — Ambulatory Visit (INDEPENDENT_AMBULATORY_CARE_PROVIDER_SITE_OTHER): Payer: Self-pay | Admitting: Internal Medicine

## 2023-08-17 ENCOUNTER — Encounter: Payer: Self-pay | Admitting: Internal Medicine

## 2023-08-17 VITALS — BP 120/77 | HR 72 | Temp 98.3°F | Resp 16 | Wt 192.2 lb

## 2023-08-17 DIAGNOSIS — Z23 Encounter for immunization: Secondary | ICD-10-CM

## 2023-08-17 DIAGNOSIS — B20 Human immunodeficiency virus [HIV] disease: Secondary | ICD-10-CM

## 2023-08-17 DIAGNOSIS — Z720 Tobacco use: Secondary | ICD-10-CM

## 2023-08-17 DIAGNOSIS — Z113 Encounter for screening for infections with a predominantly sexual mode of transmission: Secondary | ICD-10-CM

## 2023-08-17 MED ORDER — CABOTEGRAVIR & RILPIVIRINE ER 600 & 900 MG/3ML IM SUER
1.0000 | Freq: Once | INTRAMUSCULAR | Status: AC
Start: 2023-08-17 — End: 2023-08-17
  Administered 2023-08-17: 1 via INTRAMUSCULAR

## 2023-08-17 NOTE — Assessment & Plan Note (Signed)
Discussed cessation 

## 2023-08-17 NOTE — Assessment & Plan Note (Signed)
Doing well on his current regimen with Cabenuva and no changes indicated. Had recent labs and reviewed with him.   Return in 2 months for cabenuva and with me in 6 months.

## 2023-08-17 NOTE — Assessment & Plan Note (Signed)
Will screen 

## 2023-08-17 NOTE — Progress Notes (Signed)
   Subjective:    Patient ID: Poseidon Schrauth, male    DOB: 17-Mar-1991, 32 y.o.   MRN: [id number]  HPI DJ is here for follow up of HIV He has been on Cabenuva and no concerns.  Has noted some weight gain but also has been eating more.  Less irritation at the injection sites now.  Has had one new partner.     Review of Systems  Constitutional:  Negative for fatigue.  Gastrointestinal:  Negative for diarrhea and nausea.  Skin:  Negative for rash.       Objective:   Physical Exam Eyes:     General: No scleral icterus. Pulmonary:     Effort: Pulmonary effort is normal.  Neurological:     Mental Status: He is alert.    SH: + tobacco       Assessment & Plan:

## 2023-08-18 LAB — CYTOLOGY, (ORAL, ANAL, URETHRAL) ANCILLARY ONLY
Chlamydia: NEGATIVE
Chlamydia: NEGATIVE
Comment: NEGATIVE
Comment: NEGATIVE
Comment: NORMAL
Comment: NORMAL
Neisseria Gonorrhea: NEGATIVE
Neisseria Gonorrhea: NEGATIVE

## 2023-08-18 LAB — URINE CYTOLOGY ANCILLARY ONLY
Chlamydia: NEGATIVE
Comment: NEGATIVE
Comment: NORMAL
Neisseria Gonorrhea: NEGATIVE

## 2023-10-11 ENCOUNTER — Telehealth: Payer: Self-pay

## 2023-10-11 NOTE — Telephone Encounter (Signed)
[facility] Patient Advocate Encounter  Patient's medications CABENUVA have been couriered to [facility] from [facility] pharmacy and will be administered at the patients appointment on 10/17/23.  Kae Heller, CPhT Specialty Pharmacy Patient [facility] for Infectious Disease Phone: [phone] Fax:  [phone]

## 2023-10-16 NOTE — Progress Notes (Unsigned)
HPI: Nathan Maynard is a 32 y.o. male who presents to the [facility] pharmacy clinic for [place] administration.  Patient Active Problem List   Diagnosis Date Noted   Tobacco abuse 04/28/2017   Encounter for long-term (current) use of high-risk medication 10/28/2016   Screening examination for venereal disease 10/28/2016   Tooth gap 12/04/2013   Depression 11/28/2012   Human immunodeficiency virus I infection (HCC) 04/07/2009   Eczema 04/07/2009    Patient's Medications  New Prescriptions   No medications on file  Previous Medications   CABENUVA 600 & 900 MG/3ML INJECTION    INJECT 1 KIT IN THE MUSCLE EVERY 2 MONTHS   CABOTEGRAVIR & RILPIVIRINE ER (CABENUVA) 600 & 900 MG/3ML INJECTION    Inject 1 kit into the muscle every 30 (thirty) days.   CETIRIZINE (ZYRTEC) 10 MG TABLET    Take 10 mg by mouth daily.  Modified Medications   No medications on file  Discontinued Medications   No medications on file    Allergies: Allergies  Allergen Reactions   Latex Itching    Labs: Lab Results  Component Value Date   HIV1RNAQUANT Not Detected 06/15/2023   HIV1RNAQUANT Not Detected 12/21/2022   HIV1RNAQUANT Not Detected 10/20/2022   CD4TABS 420 02/21/2019   CD4TABS 430 08/23/2018   CD4TABS 420 01/12/2018    RPR and STI Lab Results  Component Value Date   LABRPR NON-REACTIVE 06/15/2023   LABRPR NON-REACTIVE 12/21/2022   LABRPR NON-REACTIVE 10/20/2022   LABRPR NON-REACTIVE 07/02/2022   LABRPR NON-REACTIVE 08/23/2018    STI Results GC GC CT CT  Latest Ref Rng & Units  NEGATIVE  NEGATIVE  08/17/2023 10:15 AM Negative    Negative    Negative   Negative    Negative    Negative    02/16/2023 10:31 AM Negative    Negative   Negative    Negative    10/20/2022 11:18 AM Negative   Negative    10/20/2022 11:17 AM Positive    Negative   Negative    Negative    07/02/2022  2:11 PM Negative   Negative    08/23/2018 12:00 AM Negative   Negative    04/07/2017 12:00 AM Negative    Negative    05/25/2016 12:00 AM Negative   Negative    04/09/2014 12:00 AM NG: Negative   CT: Negative    07/14/2013  1:16 AM  POSITIVE   POSITIVE     Hepatitis B Lab Results  Component Value Date   HEPBSAB REACTIVE (A) 07/02/2022   HEPBSAG NON-REACTIVE 07/02/2022   HEPBCAB NON-REACTIVE 07/02/2022   Hepatitis C Lab Results  Component Value Date   HEPCAB NON-REACTIVE 07/02/2022   Hepatitis A Lab Results  Component Value Date   HAV REACTIVE (A) 07/02/2022   Lipids: Lab Results  Component Value Date   CHOL 210 (H) 06/15/2023   TRIG 82 06/15/2023   HDL 56 06/15/2023   CHOLHDL 3.8 06/15/2023   VLDL 12 04/07/2017   LDLCALC 136 (H) 06/15/2023    TARGET DATE: The 7th of the month  Assessment: DJ presents today for their maintenance Cabenuva injections. Past injections were tolerated well without issues. He remains well controlled with Cabenuva. Last STI testing was 08/17/23 and was negative. DJ *** STI testing today.  Administered cabotegravir 600mg /54mL in left upper outer quadrant of the gluteal muscle. Administered rilpivirine 900 mg/13mL in the right upper outer quadrant of the gluteal muscle. No issues with injections. DJ will follow up in  2 months for next set of injections.  Immunizations: COVID and influenza  Plan: - Cabenuva injections administered - Next injections scheduled for ***, See Dr. Luciana Axe in 4 months - Call with any issues or questions  Lora Paula, PharmD PGY-2 Infectious Diseases Pharmacy Resident 10/17/2023 8:40 AM

## 2023-10-17 ENCOUNTER — Ambulatory Visit: Payer: Self-pay | Admitting: Pharmacist

## 2023-10-19 NOTE — Progress Notes (Unsigned)
HPI: Nathan Maynard is a 32 y.o. male who presents to the [facility] pharmacy clinic for [place] administration.  Patient Active Problem List   Diagnosis Date Noted   Tobacco abuse 04/28/2017   Encounter for long-term (current) use of high-risk medication 10/28/2016   Screening examination for venereal disease 10/28/2016   Tooth gap 12/04/2013   Depression 11/28/2012   Human immunodeficiency virus I infection (HCC) 04/07/2009   Eczema 04/07/2009    Patient's Medications  New Prescriptions   No medications on file  Previous Medications   CABENUVA 600 & 900 MG/3ML INJECTION    INJECT 1 KIT IN THE MUSCLE EVERY 2 MONTHS   CABOTEGRAVIR & RILPIVIRINE ER (CABENUVA) 600 & 900 MG/3ML INJECTION    Inject 1 kit into the muscle every 30 (thirty) days.   CETIRIZINE (ZYRTEC) 10 MG TABLET    Take 10 mg by mouth daily.  Modified Medications   No medications on file  Discontinued Medications   No medications on file    Allergies: Allergies  Allergen Reactions   Latex Itching    Labs: Lab Results  Component Value Date   HIV1RNAQUANT Not Detected 06/15/2023   HIV1RNAQUANT Not Detected 12/21/2022   HIV1RNAQUANT Not Detected 10/20/2022   CD4TABS 420 02/21/2019   CD4TABS 430 08/23/2018   CD4TABS 420 01/12/2018    RPR and STI Lab Results  Component Value Date   LABRPR NON-REACTIVE 06/15/2023   LABRPR NON-REACTIVE 12/21/2022   LABRPR NON-REACTIVE 10/20/2022   LABRPR NON-REACTIVE 07/02/2022   LABRPR NON-REACTIVE 08/23/2018    STI Results GC GC CT CT  Latest Ref Rng & Units  NEGATIVE  NEGATIVE  08/17/2023 10:15 AM Negative    Negative    Negative   Negative    Negative    Negative    02/16/2023 10:31 AM Negative    Negative   Negative    Negative    10/20/2022 11:18 AM Negative   Negative    10/20/2022 11:17 AM Positive    Negative   Negative    Negative    07/02/2022  2:11 PM Negative   Negative    08/23/2018 12:00 AM Negative   Negative    04/07/2017 12:00 AM Negative    Negative    05/25/2016 12:00 AM Negative   Negative    04/09/2014 12:00 AM NG: Negative   CT: Negative    07/14/2013  1:16 AM  POSITIVE   POSITIVE     Hepatitis B Lab Results  Component Value Date   HEPBSAB REACTIVE (A) 07/02/2022   HEPBSAG NON-REACTIVE 07/02/2022   HEPBCAB NON-REACTIVE 07/02/2022   Hepatitis C Lab Results  Component Value Date   HEPCAB NON-REACTIVE 07/02/2022   Hepatitis A Lab Results  Component Value Date   HAV REACTIVE (A) 07/02/2022   Lipids: Lab Results  Component Value Date   CHOL 210 (H) 06/15/2023   TRIG 82 06/15/2023   HDL 56 06/15/2023   CHOLHDL 3.8 06/15/2023   VLDL 12 04/07/2017   LDLCALC 136 (H) 06/15/2023    TARGET DATE: The 7th of the month  Assessment: Nathan Maynard presents today for his maintenance Cabenuva injections. Past injections were tolerated well without issues. Nathan Maynard's last provider visit was 08/17/23 with Dr. Luciana Axe. He is due for labs at next visit with him in March. Reports no new concerns for STIs at this visit. He does request routine STI testing. Reinforced the importance of making it to appointments, as we rescheduled today. Nathan Maynard reports that this should not be  a consistent concern.  Immunizations: Nathan Maynard is due COVID vaccines today. He will get this today. He will be due for Menveo at next visit.  Administered cabotegravir 600mg /87mL in left upper outer quadrant of the gluteal muscle. Administered rilpivirine 900 mg/39mL in the right upper outer quadrant of the gluteal muscle. No issues with injections. Nathan Maynard will follow up in 2 months for next set of injections.  Plan: - Cabenuva injections administered - STI testing: RPR, oral/urine/rectal cytologies for gonorrhea/chlamydia - Immunizations: COVID in office today - Next injections scheduled for 12/15/23 with Cassie, 02/16/24 with Dr. Luciana Axe - Call with any issues or questions  Lora Paula, PharmD PGY-2 Infectious Diseases Pharmacy Resident [facility] 10/20/2023 10:51  AM

## 2023-10-20 ENCOUNTER — Other Ambulatory Visit: Payer: Self-pay

## 2023-10-20 ENCOUNTER — Ambulatory Visit: Payer: Self-pay

## 2023-10-20 ENCOUNTER — Ambulatory Visit (INDEPENDENT_AMBULATORY_CARE_PROVIDER_SITE_OTHER): Payer: Self-pay | Admitting: Pharmacist

## 2023-10-20 DIAGNOSIS — Z113 Encounter for screening for infections with a predominantly sexual mode of transmission: Secondary | ICD-10-CM

## 2023-10-20 DIAGNOSIS — B20 Human immunodeficiency virus [HIV] disease: Secondary | ICD-10-CM

## 2023-10-20 DIAGNOSIS — Z23 Encounter for immunization: Secondary | ICD-10-CM

## 2023-10-20 MED ORDER — CABOTEGRAVIR & RILPIVIRINE ER 600 & 900 MG/3ML IM SUER
1.0000 | Freq: Once | INTRAMUSCULAR | Status: AC
  Administered 2023-10-20: 1 via INTRAMUSCULAR

## 2023-10-21 LAB — CYTOLOGY, (ORAL, ANAL, URETHRAL) ANCILLARY ONLY
Chlamydia: NEGATIVE
Comment: NEGATIVE
Comment: NORMAL
Neisseria Gonorrhea: NEGATIVE

## 2023-10-21 LAB — URINE CYTOLOGY ANCILLARY ONLY
Chlamydia: NEGATIVE
Comment: NEGATIVE
Comment: NORMAL
Neisseria Gonorrhea: NEGATIVE

## 2023-10-21 LAB — RPR: RPR Ser Ql: NONREACTIVE

## 2023-10-24 LAB — CYTOLOGY, (ORAL, ANAL, URETHRAL) ANCILLARY ONLY
Chlamydia: NEGATIVE
Comment: NEGATIVE
Comment: NORMAL
Neisseria Gonorrhea: NEGATIVE

## 2023-11-24 ENCOUNTER — Ambulatory Visit: Payer: Self-pay

## 2023-12-08 ENCOUNTER — Other Ambulatory Visit (HOSPITAL_COMMUNITY): Payer: Self-pay

## 2023-12-08 ENCOUNTER — Other Ambulatory Visit: Payer: Self-pay

## 2023-12-08 ENCOUNTER — Other Ambulatory Visit: Payer: Self-pay | Admitting: Pharmacist

## 2023-12-08 DIAGNOSIS — Z21 Asymptomatic human immunodeficiency virus [HIV] infection status: Secondary | ICD-10-CM

## 2023-12-08 MED ORDER — CABENUVA 600 & 900 MG/3ML IM SUER
1.0000 | INTRAMUSCULAR | 5 refills | Status: DC
  Filled 2023-12-08: qty 6, 56d supply, fill #0
  Filled 2023-12-08: qty 6, 60d supply, fill #0
  Filled 2024-01-27: qty 6, 56d supply, fill #1
  Filled 2024-03-29: qty 6, 56d supply, fill #2

## 2023-12-08 NOTE — Progress Notes (Signed)
Specialty Pharmacy Initial Fill Coordination Note  Nathan Maynard is a 32 y.o. male contacted today regarding initial fill of specialty medication(s) Cabotegravir & Rilpivirine Nathan Maynard)   Patient requested Courier to Provider Office   Delivery date: 12/12/23   Verified address: [address] [place] [address]   Medication will be filled on 12/09/23.   Patient is aware of 0.00 copayment.

## 2023-12-09 ENCOUNTER — Telehealth: Payer: Self-pay

## 2023-12-09 ENCOUNTER — Other Ambulatory Visit: Payer: Self-pay

## 2023-12-09 ENCOUNTER — Other Ambulatory Visit (HOSPITAL_COMMUNITY): Payer: Self-pay

## 2023-12-09 NOTE — Telephone Encounter (Signed)
[facility] Patient Advocate Encounter   Received notification from [facility] that prior authorization for Nathan Maynard is required.   PA submitted on 12/09/23 Key [id number] Status is pending    [facility] will continue to follow.   [name], CPhT Specialty Pharmacy Patient [facility] for Infectious Disease Phone: [phone] Fax:  [phone]

## 2023-12-09 NOTE — Telephone Encounter (Addendum)
Pharmacy Patient Advocate Encounter- Cabenuva BIV-Pharmacy Benefit:  PA was submitted to [employer] and has been approved through: 12/09/23-12/08/24 Authorization# [id number]  Please send prescription to Specialty Pharmacy: [facility]: [phone]  Estimated Copay is: 0.00

## 2023-12-12 ENCOUNTER — Telehealth: Payer: Self-pay

## 2023-12-12 NOTE — Telephone Encounter (Signed)
[facility] Patient Advocate Encounter  Patient's medications CABENUVA have been couriered to [facility] from [facility] pharmacy and will be administered at the patients appointment on 12/15/23.  Kae Heller, CPhT Specialty Pharmacy Patient [facility] for Infectious Disease Phone: [phone] Fax:  [phone]

## 2023-12-13 NOTE — Progress Notes (Signed)
 HPI: Nathan Maynard is a 32 y.o. male who presents to the [facility] pharmacy clinic for Cabenuva  administration.  Patient Active Problem List   Diagnosis Date Noted   Tobacco abuse 04/28/2017   Encounter for long-term (current) use of high-risk medication 10/28/2016   Screening examination for venereal disease 10/28/2016   Tooth gap 12/04/2013   Depression 11/28/2012   Human immunodeficiency virus I infection (HCC) 04/07/2009   Eczema 04/07/2009    Patient's Medications  New Prescriptions   No medications on file  Previous Medications   CABOTEGRAVIR  & RILPIVIRINE  ER (CABENUVA ) 600 & 900 MG/3ML INJECTION    Inject 1 kit into the muscle every 2 (two) months.   CETIRIZINE (ZYRTEC) 10 MG TABLET    Take 10 mg by mouth daily.  Modified Medications   No medications on file  Discontinued Medications   No medications on file    Allergies: Allergies  Allergen Reactions   Latex Itching    Past Medical History: Past Medical History:  Diagnosis Date   HIV infection (HCC)     Social History: Social History   Socioeconomic History   Marital status: Single    Spouse name: Not on file   Number of children: Not on file   Years of education: Not on file   Highest education level: Not on file  Occupational History   Not on file  Tobacco Use   Smoking status: Some Days    Current packs/day: 0.50    Average packs/day: 0.5 packs/day for 6.0 years (3.0 ttl pk-yrs)    Types: Cigarettes   Smokeless tobacco: Never  Substance and Sexual Activity   Alcohol use: Yes    Comment: occasional   Drug use: Yes    Types: Marijuana   Sexual activity: Not Currently    Partners: Male, Male    Birth control/protection: Condom    Comment: refused condoms 8/1  Other Topics Concern   Not on file  Social History Narrative   Not on file   Social Drivers of Health   Financial Resource Strain: Not on file  Food Insecurity: Not on file  Transportation Needs: Not on file  Physical Activity:  Not on file  Stress: Not on file  Social Connections: Not on file    Labs: Lab Results  Component Value Date   HIV1RNAQUANT Not Detected 06/15/2023   HIV1RNAQUANT Not Detected 12/21/2022   HIV1RNAQUANT Not Detected 10/20/2022   CD4TABS 420 02/21/2019   CD4TABS 430 08/23/2018   CD4TABS 420 01/12/2018    RPR and STI Lab Results  Component Value Date   LABRPR NON-REACTIVE 10/20/2023   LABRPR NON-REACTIVE 06/15/2023   LABRPR NON-REACTIVE 12/21/2022   LABRPR NON-REACTIVE 10/20/2022   LABRPR NON-REACTIVE 07/02/2022    STI Results GC GC CT CT  Latest Ref Rng & Units  NEGATIVE  NEGATIVE  10/20/2023 10:47 AM Negative    Negative    Negative   Negative    Negative    Negative    08/17/2023 10:15 AM Negative    Negative    Negative   Negative    Negative    Negative    02/16/2023 10:31 AM Negative    Negative   Negative    Negative    10/20/2022 11:18 AM Negative   Negative    10/20/2022 11:17 AM Positive    Negative   Negative    Negative    07/02/2022  2:11 PM Negative   Negative    08/23/2018 12:00 AM Negative  Negative    04/07/2017 12:00 AM Negative   Negative    05/25/2016 12:00 AM Negative   Negative    04/09/2014 12:00 AM NG: Negative   CT: Negative    07/14/2013  1:16 AM  POSITIVE   POSITIVE     Hepatitis B Lab Results  Component Value Date   HEPBSAB REACTIVE (A) 07/02/2022   HEPBSAG NON-REACTIVE 07/02/2022   HEPBCAB NON-REACTIVE 07/02/2022   Hepatitis C Lab Results  Component Value Date   HEPCAB NON-REACTIVE 07/02/2022   Hepatitis A Lab Results  Component Value Date   HAV REACTIVE (A) 07/02/2022   Lipids: Lab Results  Component Value Date   CHOL 210 (H) 06/15/2023   TRIG 82 06/15/2023   HDL 56 06/15/2023   CHOLHDL 3.8 06/15/2023   VLDL 12 04/07/2017   LDLCALC 136 (H) 06/15/2023    TARGET DATE:  The 7th of the month  Assessment: Dj presents today for their maintenance Cabenuva  injections. Initial/past injections were tolerated well  without issues. No problems with systemic effects of injections. Palpated small knot on his right side that he states has been present since September but is not painful.   Administered cabotegravir  600mg /53mL in left upper outer quadrant of the gluteal muscle. Administered rilpivirine  900 mg/3mL in the right upper outer quadrant of the gluteal muscle. Monitored patient for 10 minutes after injection. Injections were tolerated well without issue. Patient will follow up in 2 months for next injection. Will check HIV RNA today along with full STI testing today; he denies any new sexual partners or concerning symptoms for STIs.  Due for Menveo booster vaccine which was administered today.   Plan: - Cabenuva  injections administered - Check HIV RNA, RPR, and urine/oral/rectal cytologies  - Administer Menveo booster vaccine  - Next injections scheduled for 3/6 with Dr. Efrain and 5/6 with Cassie  - Call with any issues or questions  Alan Geralds, PharmD, CPP, BCIDP, AAHIVP Clinical Pharmacist Practitioner Infectious Diseases Clinical Pharmacist [facility]

## 2023-12-15 ENCOUNTER — Ambulatory Visit (INDEPENDENT_AMBULATORY_CARE_PROVIDER_SITE_OTHER): Payer: Self-pay | Admitting: Pharmacist

## 2023-12-15 ENCOUNTER — Other Ambulatory Visit: Payer: Self-pay

## 2023-12-15 DIAGNOSIS — Z23 Encounter for immunization: Secondary | ICD-10-CM

## 2023-12-15 DIAGNOSIS — B20 Human immunodeficiency virus [HIV] disease: Secondary | ICD-10-CM

## 2023-12-15 DIAGNOSIS — Z21 Asymptomatic human immunodeficiency virus [HIV] infection status: Secondary | ICD-10-CM

## 2023-12-15 DIAGNOSIS — Z113 Encounter for screening for infections with a predominantly sexual mode of transmission: Secondary | ICD-10-CM

## 2023-12-15 MED ORDER — CABOTEGRAVIR & RILPIVIRINE ER 600 & 900 MG/3ML IM SUER
1.0000 | Freq: Once | INTRAMUSCULAR | Status: AC
  Administered 2023-12-15: 1 via INTRAMUSCULAR

## 2023-12-16 ENCOUNTER — Other Ambulatory Visit (HOSPITAL_COMMUNITY): Payer: Self-pay

## 2023-12-16 LAB — CT/NG RNA, TMA RECTAL
Chlamydia Trachomatis RNA: NOT DETECTED
Neisseria Gonorrhoeae RNA: NOT DETECTED

## 2023-12-16 LAB — C. TRACHOMATIS/N. GONORRHOEAE RNA
C. trachomatis RNA, TMA: NOT DETECTED
N. gonorrhoeae RNA, TMA: NOT DETECTED

## 2023-12-16 LAB — GC/CHLAMYDIA PROBE, AMP (THROAT)
Chlamydia trachomatis RNA: NOT DETECTED
Neisseria gonorrhoeae RNA: NOT DETECTED

## 2023-12-17 LAB — RPR: RPR Ser Ql: NONREACTIVE

## 2023-12-17 LAB — HIV-1 RNA QUANT-NO REFLEX-BLD
HIV 1 RNA Quant: NOT DETECTED {copies}/mL
HIV-1 RNA Quant, Log: NOT DETECTED {Log_copies}/mL

## 2023-12-27 ENCOUNTER — Other Ambulatory Visit (HOSPITAL_COMMUNITY): Payer: Self-pay

## 2024-01-16 ENCOUNTER — Other Ambulatory Visit (HOSPITAL_COMMUNITY): Payer: Self-pay

## 2024-01-27 ENCOUNTER — Other Ambulatory Visit: Payer: Self-pay

## 2024-01-27 ENCOUNTER — Other Ambulatory Visit (HOSPITAL_COMMUNITY): Payer: Self-pay

## 2024-01-27 NOTE — Progress Notes (Signed)
Specialty Pharmacy Refill Coordination Note  Nathan Maynard is a 33 y.o. male assessed today regarding refills of clinic administered specialty medication(s) Cabotegravir & Rilpivirine Nathan Maynard)   Clinic requested Courier to Provider Office   Delivery date: 02/07/24   Verified address: [address] [place] [address]   Medication will be filled on 02/06/24.

## 2024-02-06 ENCOUNTER — Other Ambulatory Visit (HOSPITAL_COMMUNITY): Payer: Self-pay

## 2024-02-06 ENCOUNTER — Other Ambulatory Visit: Payer: Self-pay

## 2024-02-06 ENCOUNTER — Telehealth: Payer: Self-pay

## 2024-02-07 ENCOUNTER — Telehealth: Payer: Self-pay

## 2024-02-07 NOTE — Telephone Encounter (Signed)
 [facility] Patient Advocate Encounter  Patient's medications CABENUVA have been couriered to [facility] from [facility] pharmacy and will be administered at the patients appointment on 02/16/24.  Kae Heller, CPhT Specialty Pharmacy Patient [facility] for Infectious Disease Phone: [phone] Fax:  [phone]

## 2024-02-16 ENCOUNTER — Ambulatory Visit: Payer: Self-pay | Admitting: Internal Medicine

## 2024-02-20 NOTE — Progress Notes (Unsigned)
 HPI: Nathan Maynard is a 33 y.o. male who presents to the [facility] pharmacy clinic for [place] administration.  Patient Active Problem List   Diagnosis Date Noted   Tobacco abuse 04/28/2017   Encounter for long-term (current) use of high-risk medication 10/28/2016   Screening examination for venereal disease 10/28/2016   Tooth gap 12/04/2013   Depression 11/28/2012   Human immunodeficiency virus I infection (HCC) 04/07/2009   Eczema 04/07/2009    Patient's Medications  New Prescriptions   No medications on file  Previous Medications   CABOTEGRAVIR & RILPIVIRINE ER (CABENUVA) 600 & 900 MG/3ML INJECTION    Inject 1 kit into the muscle every 2 (two) months.   CETIRIZINE (ZYRTEC) 10 MG TABLET    Take 10 mg by mouth daily.  Modified Medications   No medications on file  Discontinued Medications   No medications on file    Allergies: Allergies  Allergen Reactions   Latex Itching    Labs: Lab Results  Component Value Date   HIV1RNAQUANT Not Detected 12/15/2023   HIV1RNAQUANT Not Detected 06/15/2023   HIV1RNAQUANT Not Detected 12/21/2022   CD4TABS 420 02/21/2019   CD4TABS 430 08/23/2018   CD4TABS 420 01/12/2018    RPR and STI Lab Results  Component Value Date   LABRPR NON-REACTIVE 12/15/2023   LABRPR NON-REACTIVE 10/20/2023   LABRPR NON-REACTIVE 06/15/2023   LABRPR NON-REACTIVE 12/21/2022   LABRPR NON-REACTIVE 10/20/2022    STI Results GC GC CT CT  Latest Ref Rng & Units  NEGATIVE  NEGATIVE  10/20/2023 10:47 AM Negative    Negative    Negative   Negative    Negative    Negative    08/17/2023 10:15 AM Negative    Negative    Negative   Negative    Negative    Negative    02/16/2023 10:31 AM Negative    Negative   Negative    Negative    10/20/2022 11:18 AM Negative   Negative    10/20/2022 11:17 AM Positive    Negative   Negative    Negative    07/02/2022  2:11 PM Negative   Negative    08/23/2018 12:00 AM Negative   Negative    04/07/2017 12:00 AM  Negative   Negative    05/25/2016 12:00 AM Negative   Negative    04/09/2014 12:00 AM NG: Negative   CT: Negative    07/14/2013  1:16 AM  POSITIVE   POSITIVE     Hepatitis B Lab Results  Component Value Date   HEPBSAB REACTIVE (A) 07/02/2022   HEPBSAG NON-REACTIVE 07/02/2022   HEPBCAB NON-REACTIVE 07/02/2022   Hepatitis C Lab Results  Component Value Date   HEPCAB NON-REACTIVE 07/02/2022   Hepatitis A Lab Results  Component Value Date   HAV REACTIVE (A) 07/02/2022   Lipids: Lab Results  Component Value Date   CHOL 210 (H) 06/15/2023   TRIG 82 06/15/2023   HDL 56 06/15/2023   CHOLHDL 3.8 06/15/2023   VLDL 12 04/07/2017   LDLCALC 136 (H) 06/15/2023    TARGET DATE: The 7th  Assessment: Nathan Maynard presents today for his maintenance Cabenuva injections. Past injections were tolerated well without issues. Last HIV RNA was undetectable in January.   Administered cabotegravir 600mg /58mL in left upper outer quadrant of the gluteal muscle. Administered rilpivirine 900 mg/40mL in the right upper outer quadrant of the gluteal muscle. No issues with injections. He will follow up in 2 months for next set of injections.  Nathan Maynard  Plan: - Cabenuva injections administered - Next injections scheduled for *** - Call with any issues or questions  Nathan Maynard L. Kimbery Harwood, PharmD, BCIDP, AAHIVP, CPP Clinical Pharmacist Practitioner Infectious Diseases Clinical Pharmacist [facility]

## 2024-02-21 ENCOUNTER — Other Ambulatory Visit: Payer: Self-pay

## 2024-02-21 ENCOUNTER — Ambulatory Visit: Payer: Self-pay | Admitting: Pharmacist

## 2024-02-21 DIAGNOSIS — Z21 Asymptomatic human immunodeficiency virus [HIV] infection status: Secondary | ICD-10-CM

## 2024-02-21 MED ORDER — CABOTEGRAVIR & RILPIVIRINE ER 600 & 900 MG/3ML IM SUER
1.0000 | Freq: Once | INTRAMUSCULAR | Status: AC
  Administered 2024-02-21: 1 via INTRAMUSCULAR

## 2024-03-29 ENCOUNTER — Other Ambulatory Visit: Payer: Self-pay

## 2024-03-29 ENCOUNTER — Other Ambulatory Visit (HOSPITAL_COMMUNITY): Payer: Self-pay

## 2024-03-29 NOTE — Progress Notes (Signed)
 Specialty Pharmacy Refill Coordination Note  Nathan Maynard is a 33 y.o. male assessed today regarding refills of clinic administered specialty medication(s) Cabotegravir & Rilpivirine (Cabenuva)   Clinic requested Courier to Provider Office   Delivery date: 04/09/24   Verified address: [address] [place] [address]   Medication will be filled on 04/06/24.

## 2024-04-06 ENCOUNTER — Other Ambulatory Visit: Payer: Self-pay

## 2024-04-06 ENCOUNTER — Other Ambulatory Visit (HOSPITAL_COMMUNITY): Payer: Self-pay

## 2024-04-06 NOTE — Progress Notes (Signed)
 Per Nathan Maynard, patient is a [facility] patient and has to use either [facility] or [facility]. Disenrolling patient from [facility] [employer].

## 2024-04-06 NOTE — Progress Notes (Deleted)
 HPI: Nathan Hettinger is a 33 y.o. male who presents to the [facility] pharmacy clinic for Cabenuva  administration.  Patient Active Problem List   Diagnosis Date Noted   Tobacco abuse 04/28/2017   Encounter for long-term (current) use of high-risk medication 10/28/2016   Screening examination for venereal disease 10/28/2016   Tooth gap 12/04/2013   Depression 11/28/2012   Human immunodeficiency virus I infection (HCC) 04/07/2009   Eczema 04/07/2009    Patient's Medications  New Prescriptions   No medications on file  Previous Medications   CABOTEGRAVIR  & RILPIVIRINE  ER (CABENUVA ) 600 & 900 MG/3ML INJECTION    Inject 1 kit into the muscle every 2 (two) months.   CETIRIZINE (ZYRTEC) 10 MG TABLET    Take 10 mg by mouth daily.  Modified Medications   No medications on file  Discontinued Medications   No medications on file    Allergies: Allergies  Allergen Reactions   Latex Itching    Past Medical History: Past Medical History:  Diagnosis Date   HIV infection (HCC)     Social History: Social History   Socioeconomic History   Marital status: Single    Spouse name: Not on file   Number of children: Not on file   Years of education: Not on file   Highest education level: Not on file  Occupational History   Not on file  Tobacco Use   Smoking status: Some Days    Current packs/day: 0.50    Average packs/day: 0.5 packs/day for 6.0 years (3.0 ttl pk-yrs)    Types: Cigarettes   Smokeless tobacco: Never  Substance and Sexual Activity   Alcohol use: Yes    Comment: occasional   Drug use: Yes    Types: Marijuana   Sexual activity: Not Currently    Partners: Male, Male    Birth control/protection: Condom    Comment: refused condoms 8/1  Other Topics Concern   Not on file  Social History Narrative   Not on file   Social Drivers of Health   Financial Resource Strain: Not on file  Food Insecurity: Not on file  Transportation Needs: Not on file  Physical Activity:  Not on file  Stress: Not on file  Social Connections: Not on file    Labs: Lab Results  Component Value Date   HIV1RNAQUANT Not Detected 12/15/2023   HIV1RNAQUANT Not Detected 06/15/2023   HIV1RNAQUANT Not Detected 12/21/2022   CD4TABS 420 02/21/2019   CD4TABS 430 08/23/2018   CD4TABS 420 01/12/2018    RPR and STI Lab Results  Component Value Date   LABRPR NON-REACTIVE 12/15/2023   LABRPR NON-REACTIVE 10/20/2023   LABRPR NON-REACTIVE 06/15/2023   LABRPR NON-REACTIVE 12/21/2022   LABRPR NON-REACTIVE 10/20/2022    STI Results GC GC CT CT  Latest Ref Rng & Units  NEGATIVE  NEGATIVE  10/20/2023 10:47 AM Negative    Negative    Negative   Negative    Negative    Negative    08/17/2023 10:15 AM Negative    Negative    Negative   Negative    Negative    Negative    02/16/2023 10:31 AM Negative    Negative   Negative    Negative    10/20/2022 11:18 AM Negative   Negative    10/20/2022 11:17 AM Positive    Negative   Negative    Negative    07/02/2022  2:11 PM Negative   Negative    08/23/2018 12:00 AM Negative  Negative    04/07/2017 12:00 AM Negative   Negative    05/25/2016 12:00 AM Negative   Negative    04/09/2014 12:00 AM NG: Negative   CT: Negative    07/14/2013  1:16 AM  POSITIVE   POSITIVE     Hepatitis B Lab Results  Component Value Date   HEPBSAB REACTIVE (A) 07/02/2022   HEPBSAG NON-REACTIVE 07/02/2022   HEPBCAB NON-REACTIVE 07/02/2022   Hepatitis C Lab Results  Component Value Date   HEPCAB NON-REACTIVE 07/02/2022   Hepatitis A Lab Results  Component Value Date   HAV REACTIVE (A) 07/02/2022   Lipids: Lab Results  Component Value Date   CHOL 210 (H) 06/15/2023   TRIG 82 06/15/2023   HDL 56 06/15/2023   CHOLHDL 3.8 06/15/2023   VLDL 12 04/07/2017   LDLCALC 136 (H) 06/15/2023    TARGET DATE:  The 7th of the month  Assessment: DJ presents today for their maintenance Cabenuva  injections. Initial/past injections were tolerated well  without issues. No problems with systemic effects of injections.   Administered cabotegravir  600mg /80mL in left upper outer quadrant of the gluteal muscle. Administered rilpivirine  900 mg/3mL in the right upper outer quadrant of the gluteal muscle. Monitored patient for 10 minutes after injection. Injections were tolerated well without issue. Patient will follow up in 2 months for next injection. Will defer HIV RNA testing along with routine labs until next visit with new provider.  Up-to-date on vaccines aside from Shingles which he *** today.   Plan: - Cabenuva  injections administered - Next injections scheduled for *** - Call with any issues or questions  Nicklas Barns, PharmD, CPP, BCIDP, AAHIVP Clinical Pharmacist Practitioner Infectious Diseases Clinical Pharmacist [facility]

## 2024-04-09 ENCOUNTER — Other Ambulatory Visit: Payer: Self-pay | Admitting: Pharmacist

## 2024-04-09 ENCOUNTER — Other Ambulatory Visit (HOSPITAL_COMMUNITY): Payer: Self-pay

## 2024-04-09 DIAGNOSIS — Z21 Asymptomatic human immunodeficiency virus [HIV] infection status: Secondary | ICD-10-CM

## 2024-04-09 MED ORDER — CABENUVA 600 & 900 MG/3ML IM SUER: 1.0000 | INTRAMUSCULAR | 5 refills | Status: AC

## 2024-04-11 ENCOUNTER — Telehealth: Payer: Self-pay

## 2024-04-11 NOTE — Telephone Encounter (Signed)
 [facility] Patient Advocate Encounter  Patient's medications CABENUVA  have been couriered to [facility] from [facility] and will be administered at the patients appointment on 04/12/24.  Verline Glow, CPhT Specialty Pharmacy Patient [facility] for Infectious Disease Phone: [phone] Fax:  [phone]

## 2024-04-12 ENCOUNTER — Ambulatory Visit: Payer: Self-pay | Admitting: Internal Medicine

## 2024-04-12 ENCOUNTER — Ambulatory Visit: Payer: Self-pay | Admitting: Pharmacist

## 2024-04-12 DIAGNOSIS — Z21 Asymptomatic human immunodeficiency virus [HIV] infection status: Secondary | ICD-10-CM

## 2024-04-13 NOTE — Progress Notes (Unsigned)
 HPI: Nathan Maynard is a 33 y.o. male who presents to the [facility] pharmacy clinic for Cabenuva  administration.  Patient Active Problem List   Diagnosis Date Noted   Tobacco abuse 04/28/2017   Encounter for long-term (current) use of high-risk medication 10/28/2016   Screening examination for venereal disease 10/28/2016   Tooth gap 12/04/2013   Depression 11/28/2012   Human immunodeficiency virus I infection (HCC) 04/07/2009   Eczema 04/07/2009    Patient's Medications  New Prescriptions   No medications on file  Previous Medications   CABOTEGRAVIR  & RILPIVIRINE  ER (CABENUVA ) 600 & 900 MG/3ML INJECTION    Inject 1 kit into the muscle every 2 (two) months.   CETIRIZINE (ZYRTEC) 10 MG TABLET    Take 10 mg by mouth daily.  Modified Medications   No medications on file  Discontinued Medications   No medications on file    Allergies: Allergies  Allergen Reactions   Latex Itching    Past Medical History: Past Medical History:  Diagnosis Date   HIV infection (HCC)     Social History: Social History   Socioeconomic History   Marital status: Single    Spouse name: Not on file   Number of children: Not on file   Years of education: Not on file   Highest education level: Not on file  Occupational History   Not on file  Tobacco Use   Smoking status: Some Days    Current packs/day: 0.50    Average packs/day: 0.5 packs/day for 6.0 years (3.0 ttl pk-yrs)    Types: Cigarettes   Smokeless tobacco: Never  Substance and Sexual Activity   Alcohol use: Yes    Comment: occasional   Drug use: Yes    Types: Marijuana   Sexual activity: Not Currently    Partners: Male, Male    Birth control/protection: Condom    Comment: refused condoms 8/1  Other Topics Concern   Not on file  Social History Narrative   Not on file   Social Drivers of Health   Financial Resource Strain: Not on file  Food Insecurity: Not on file  Transportation Needs: Not on file  Physical Activity:  Not on file  Stress: Not on file  Social Connections: Not on file    Labs: Lab Results  Component Value Date   HIV1RNAQUANT Not Detected 12/15/2023   HIV1RNAQUANT Not Detected 06/15/2023   HIV1RNAQUANT Not Detected 12/21/2022   CD4TABS 420 02/21/2019   CD4TABS 430 08/23/2018   CD4TABS 420 01/12/2018    RPR and STI Lab Results  Component Value Date   LABRPR NON-REACTIVE 12/15/2023   LABRPR NON-REACTIVE 10/20/2023   LABRPR NON-REACTIVE 06/15/2023   LABRPR NON-REACTIVE 12/21/2022   LABRPR NON-REACTIVE 10/20/2022    STI Results GC GC CT CT  Latest Ref Rng & Units  NEGATIVE  NEGATIVE  10/20/2023 10:47 AM Negative    Negative    Negative   Negative    Negative    Negative    08/17/2023 10:15 AM Negative    Negative    Negative   Negative    Negative    Negative    02/16/2023 10:31 AM Negative    Negative   Negative    Negative    10/20/2022 11:18 AM Negative   Negative    10/20/2022 11:17 AM Positive    Negative   Negative    Negative    07/02/2022  2:11 PM Negative   Negative    08/23/2018 12:00 AM Negative  Negative    04/07/2017 12:00 AM Negative   Negative    05/25/2016 12:00 AM Negative   Negative    04/09/2014 12:00 AM NG: Negative   CT: Negative    07/14/2013  1:16 AM  POSITIVE   POSITIVE     Hepatitis B Lab Results  Component Value Date   HEPBSAB REACTIVE (A) 07/02/2022   HEPBSAG NON-REACTIVE 07/02/2022   HEPBCAB NON-REACTIVE 07/02/2022   Hepatitis C Lab Results  Component Value Date   HEPCAB NON-REACTIVE 07/02/2022   Hepatitis A Lab Results  Component Value Date   HAV REACTIVE (A) 07/02/2022   Lipids: Lab Results  Component Value Date   CHOL 210 (H) 06/15/2023   TRIG 82 06/15/2023   HDL 56 06/15/2023   CHOLHDL 3.8 06/15/2023   VLDL 12 04/07/2017   LDLCALC 136 (H) 06/15/2023    TARGET DATE:  The 7th of the month  Assessment: Nathan Maynard presents today for their maintenance Cabenuva  injections. Initial/past injections were tolerated well  without issues. No problems with systemic effects of injections.   Administered cabotegravir  600mg /80mL in left upper outer quadrant of the gluteal muscle. Administered rilpivirine  900 mg/3mL in the right upper outer quadrant of the gluteal muscle. Monitored patient for 10 minutes after injection. Injections were tolerated well without issue. Patient will follow up in 2 months for next injection. Will defer HIV RNA testing along with routine labs until next visit with new provider.  Up-to-date on vaccines aside from Shingles which he *** today.   Plan: - Cabenuva  injections administered - Next injections scheduled for *** - Call with any issues or questions  Nicklas Barns, PharmD, CPP, BCIDP, AAHIVP Clinical Pharmacist Practitioner Infectious Diseases Clinical Pharmacist [facility]

## 2024-04-16 ENCOUNTER — Ambulatory Visit: Payer: Self-pay | Admitting: Pharmacist

## 2024-04-16 ENCOUNTER — Other Ambulatory Visit: Payer: Self-pay

## 2024-04-16 DIAGNOSIS — Z21 Asymptomatic human immunodeficiency virus [HIV] infection status: Secondary | ICD-10-CM

## 2024-04-16 MED ORDER — CABOTEGRAVIR & RILPIVIRINE ER 600 & 900 MG/3ML IM SUER
1.0000 | Freq: Once | INTRAMUSCULAR | Status: AC
  Administered 2024-04-16: 1 via INTRAMUSCULAR

## 2024-04-17 ENCOUNTER — Ambulatory Visit: Payer: Self-pay | Admitting: Pharmacist

## 2024-04-25 ENCOUNTER — Ambulatory Visit: Payer: Self-pay | Admitting: Family

## 2024-06-06 ENCOUNTER — Telehealth: Payer: Self-pay

## 2024-06-06 NOTE — Telephone Encounter (Signed)
 [facility] Patient Advocate Encounter  Patient's medications CABENUVA  have been couriered to [facility] from [facility] and will be administered at the patients appointment on 06/12/2024.  Charmaine Sharps, CPhT Specialty Pharmacy Patient [facility] for Infectious Disease Phone: [phone] Fax:  [phone]

## 2024-06-12 ENCOUNTER — Encounter: Payer: Self-pay | Admitting: Internal Medicine

## 2024-06-12 ENCOUNTER — Ambulatory Visit: Payer: Self-pay | Admitting: Internal Medicine

## 2024-06-12 ENCOUNTER — Other Ambulatory Visit: Payer: Self-pay

## 2024-06-12 ENCOUNTER — Ambulatory Visit: Payer: Self-pay | Admitting: Pharmacist

## 2024-06-12 VITALS — BP 122/85 | HR 67 | Temp 98.3°F | Wt 176.0 lb

## 2024-06-12 DIAGNOSIS — Z113 Encounter for screening for infections with a predominantly sexual mode of transmission: Secondary | ICD-10-CM

## 2024-06-12 DIAGNOSIS — B20 Human immunodeficiency virus [HIV] disease: Secondary | ICD-10-CM

## 2024-06-12 MED ORDER — CABOTEGRAVIR & RILPIVIRINE ER 600 & 900 MG/3ML IM SUER
1.0000 | Freq: Once | INTRAMUSCULAR | Status: AC
  Administered 2024-06-12: 1 via INTRAMUSCULAR

## 2024-06-12 NOTE — Patient Instructions (Signed)
 You are doing very well  Will continue cabenuva  for now  Labs today  I'll see you in 1 year; pharmacy team will continue seeing you every 2 months for cabenuva  injection otherwise

## 2024-06-12 NOTE — Progress Notes (Signed)
   Subjective:    Patient ID: Nathan Maynard, male    DOB: 1991-02-28, 33 y.o.   MRN: [id number]  HPI Nathan Maynard is here for follow up of HIV  06/12/24 id clinic f/u He has been on Cabenuva  and no concerns.   See a&p for detail   Review of Systems  Constitutional:  Negative for fatigue.  Gastrointestinal:  Negative for diarrhea and nausea.  Skin:  Negative for rash.       Objective:   Physical Exam  Eyes:     General: No scleral icterus.  Pulmonary:     Effort: Pulmonary effort is normal.   Neurological:     Mental Status: He is alert.     Labs: Lab Results  Component Value Date   WBC 8.8 06/15/2023   HGB 14.9 06/15/2023   HCT 43.9 06/15/2023   MCV 91.3 06/15/2023   PLT 307 06/15/2023   Last metabolic panel Lab Results  Component Value Date   GLUCOSE 88 06/15/2023   NA 138 06/15/2023   K 4.0 06/15/2023   CL 103 06/15/2023   CO2 30 06/15/2023   BUN 13 06/15/2023   CREATININE 1.25 06/15/2023   EGFR 82 07/02/2022   CALCIUM 9.9 06/15/2023   PHOS 3.7 10/25/2011   PROT 7.2 06/15/2023   ALBUMIN 4.1 04/07/2017   BILITOT 0.6 06/15/2023   ALKPHOS 53 04/07/2017   AST 15 06/15/2023   ALT 14 06/15/2023   HIV: 12/2023     ud 06/2023     ud        /       824 (31%) 06/2022     UD       /       633 (31%)       Assessment & Plan:   #hiv Dx'ed 2009; msm Hx AIDS -- cd4 nadir 26 (3%) @ 2010; oroesophageal candidiasis Therapy Atazanavir /ritonavir /truvada --> biktarvy --> cabenuva   06/12/24 has been on cabenuva  since 07/2023   #social Smoker - 1/4 ppd No etoh, or illicits otherwise Working - [employer] Not in relationship as of 06/12/24; sexually active   #other pmh reviewed 06/2024 Eczema Allergic rhinitis   #hcm -vaccination Meningococcal 2025 Prevnar 20 2023 Tdap 2023 S/p hpv series by 02/2019 -hepatitis S/p non-adj hep b vaccination by 01/2022 S/p hep a Vac series 06/2022 hep b sAb reactive; sAg/cAb nonreactive 06/2022 hep c ab nr -std  screening No hx syphilis -tb screening 06/2022 quantiferon gold negative -cancer screening N/a

## 2024-06-12 NOTE — Addendum Note (Signed)
 Addended by: Kyng Matlock M on: 06/12/2024 09:23 AM   Modules accepted: Orders

## 2024-06-13 LAB — T-HELPER CELLS (CD4) COUNT (NOT AT ARMC)
CD4 % Helper T Cell: 32 % — ABNORMAL LOW (ref 33–65)
CD4 T Cell Abs: 772 /uL (ref 400–1790)

## 2024-06-13 LAB — CYTOLOGY, (ORAL, ANAL, URETHRAL) ANCILLARY ONLY
Chlamydia: NEGATIVE
Chlamydia: NEGATIVE
Comment: NEGATIVE
Comment: NEGATIVE
Comment: NORMAL
Comment: NORMAL
Neisseria Gonorrhea: NEGATIVE
Neisseria Gonorrhea: NEGATIVE

## 2024-06-13 LAB — URINE CYTOLOGY ANCILLARY ONLY
Chlamydia: NEGATIVE
Comment: NEGATIVE
Comment: NEGATIVE
Comment: NORMAL
Neisseria Gonorrhea: NEGATIVE
Trichomonas: NEGATIVE

## 2024-06-16 LAB — COMPLETE METABOLIC PANEL WITHOUT GFR
AG Ratio: 1.8 (calc) (ref 1.0–2.5)
ALT: 6 U/L — ABNORMAL LOW (ref 9–46)
AST: 12 U/L (ref 10–40)
Albumin: 4.6 g/dL (ref 3.6–5.1)
Alkaline phosphatase (APISO): 77 U/L (ref 36–130)
BUN: 8 mg/dL (ref 7–25)
CO2: 30 mmol/L (ref 20–32)
Calcium: 9.4 mg/dL (ref 8.6–10.3)
Chloride: 104 mmol/L (ref 98–110)
Creat: 1.25 mg/dL (ref 0.60–1.26)
Globulin: 2.5 g/dL (ref 1.9–3.7)
Glucose, Bld: 81 mg/dL (ref 65–99)
Potassium: 3.6 mmol/L (ref 3.5–5.3)
Sodium: 141 mmol/L (ref 135–146)
Total Bilirubin: 0.5 mg/dL (ref 0.2–1.2)
Total Protein: 7.1 g/dL (ref 6.1–8.1)

## 2024-06-16 LAB — CBC
HCT: 45.9 % (ref 38.5–50.0)
Hemoglobin: 15 g/dL (ref 13.2–17.1)
MCH: 30.5 pg (ref 27.0–33.0)
MCHC: 32.7 g/dL (ref 32.0–36.0)
MCV: 93.3 fL (ref 80.0–100.0)
MPV: 9.1 fL (ref 7.5–12.5)
Platelets: 313 Thousand/uL (ref 140–400)
RBC: 4.92 Million/uL (ref 4.20–5.80)
RDW: 12.6 % (ref 11.0–15.0)
WBC: 9.3 Thousand/uL (ref 3.8–10.8)

## 2024-06-16 LAB — HIV-1 RNA QUANT-NO REFLEX-BLD
HIV 1 RNA Quant: NOT DETECTED {copies}/mL
HIV-1 RNA Quant, Log: NOT DETECTED {Log_copies}/mL

## 2024-06-16 LAB — T.PALLIDUM AB, TOTAL: T pallidum Antibodies (TP-PA): REACTIVE — AB

## 2024-06-18 ENCOUNTER — Ambulatory Visit: Payer: Self-pay | Admitting: Internal Medicine

## 2024-06-18 ENCOUNTER — Ambulatory Visit: Payer: Self-pay

## 2024-06-18 NOTE — Progress Notes (Signed)
 Patient called back to discuss results. Patient became agitated regarding positive RPR results and requiring treatment. Scheduled for nurse visit. Discussed with patient how syphillis is transmitted and what lab we do to test for this. Lorenda CHRISTELLA Code, RMA

## 2024-06-18 NOTE — Telephone Encounter (Signed)
-----   Message from Constance ONEIDA Passer sent at 06/18/2024  3:57 PM EDT ----- Hi team Could you please check with [facility] about his syphilis serology/tx hx  He had reported no prior syphilis infection but tppa is negative  Options would be (if on prior treatment) 3 bicillin  shots weekly or 28 days doxy 100 mg po bid. Please see what he wants   thanks ----- Message ----- From: Rebecka Hose Lab Results In Sent: 06/12/2024  10:54 PM EDT To: Constance ONEIDA Passer, MD

## 2024-06-18 NOTE — Telephone Encounter (Signed)
 Attempted to call patient regarding results. Not able to reach him at this time. Requested call back to review results.  Per [facility] no syphillis hx on their end. Lorenda CHRISTELLA Code, RMA

## 2024-06-19 ENCOUNTER — Ambulatory Visit: Payer: Self-pay

## 2024-06-19 ENCOUNTER — Other Ambulatory Visit: Payer: Self-pay

## 2024-06-19 DIAGNOSIS — A539 Syphilis, unspecified: Secondary | ICD-10-CM

## 2024-06-19 MED ORDER — PENICILLIN G BENZATHINE 1200000 UNIT/2ML IM SUSY
1.2000 10*6.[IU] | PREFILLED_SYRINGE | Freq: Once | INTRAMUSCULAR | Status: AC
  Administered 2024-06-19: 1.2 10*6.[IU] via INTRAMUSCULAR

## 2024-06-26 ENCOUNTER — Ambulatory Visit: Payer: Self-pay

## 2024-06-26 ENCOUNTER — Other Ambulatory Visit: Payer: Self-pay

## 2024-06-26 DIAGNOSIS — A539 Syphilis, unspecified: Secondary | ICD-10-CM

## 2024-06-26 MED ORDER — PENICILLIN G BENZATHINE 1200000 UNIT/2ML IM SUSY
1.2000 10*6.[IU] | PREFILLED_SYRINGE | Freq: Once | INTRAMUSCULAR | Status: AC
Start: 2024-06-26 — End: 2024-06-26
  Administered 2024-06-26: 1.2 10*6.[IU] via INTRAMUSCULAR

## 2024-06-26 MED ORDER — PENICILLIN G BENZATHINE 1200000 UNIT/2ML IM SUSY
1.2000 10*6.[IU] | PREFILLED_SYRINGE | Freq: Once | INTRAMUSCULAR | Status: AC
  Administered 2024-06-26: 1.2 10*6.[IU] via INTRAMUSCULAR

## 2024-07-02 ENCOUNTER — Telehealth: Payer: Self-pay

## 2024-07-02 NOTE — Telephone Encounter (Signed)
 Patient missed appointment for Bicillin  this morning, called to reschedule, no answer. Left HIPAA compliant voicemail requesting callback.   Erikah Thumm, BSN, RN

## 2024-07-03 ENCOUNTER — Ambulatory Visit: Payer: Self-pay

## 2024-07-03 ENCOUNTER — Other Ambulatory Visit: Payer: Self-pay

## 2024-07-03 DIAGNOSIS — A539 Syphilis, unspecified: Secondary | ICD-10-CM

## 2024-07-03 MED ORDER — PENICILLIN G BENZATHINE 1200000 UNIT/2ML IM SUSY
1.2000 10*6.[IU] | PREFILLED_SYRINGE | Freq: Once | INTRAMUSCULAR | Status: AC
  Administered 2024-07-03: 1.2 10*6.[IU] via INTRAMUSCULAR

## 2024-07-03 NOTE — Progress Notes (Signed)
 Nurse Visit  Nathan Maynard Sep 22, 1991   Allergies  Allergen Reactions   Latex Itching    Medications administered: Bicillin  2.4 million units IM  Immunizations administered: none  Advised patient no sex until treatment is completed plus an additional 7 days and instructed to notify sexual partners for testing and treatment. Patient verbalized understanding and has no further questions.    Patient tolerated well.    Nathan Maynard, BSN, RN

## 2024-08-07 NOTE — Progress Notes (Signed)
 HPI: Katlin Nick is a 33 y.o. male who presents to the [facility] pharmacy clinic for Cabenuva  administration.  Patient Active Problem List   Diagnosis Date Noted   Tobacco abuse 04/28/2017   Encounter for long-term (current) use of high-risk medication 10/28/2016   Screening examination for venereal disease 10/28/2016   Tooth gap 12/04/2013   Depression 11/28/2012   Human immunodeficiency virus I infection (HCC) 04/07/2009   Eczema 04/07/2009    Patient's Medications  New Prescriptions   No medications on file  Previous Medications   CABOTEGRAVIR  & RILPIVIRINE  ER (CABENUVA ) 600 & 900 MG/3ML INJECTION    Inject 1 kit into the muscle every 2 (two) months.   CETIRIZINE (ZYRTEC) 10 MG TABLET    Take 10 mg by mouth daily.  Modified Medications   No medications on file  Discontinued Medications   No medications on file    Allergies: Allergies  Allergen Reactions   Latex Itching    Labs: Lab Results  Component Value Date   HIV1RNAQUANT NOT DETECTED 06/12/2024   HIV1RNAQUANT Not Detected 12/15/2023   HIV1RNAQUANT Not Detected 06/15/2023   CD4TABS 772 06/12/2024   CD4TABS 420 02/21/2019   CD4TABS 430 08/23/2018    RPR and STI Lab Results  Component Value Date   LABRPR NON-REACTIVE 12/15/2023   LABRPR NON-REACTIVE 10/20/2023   LABRPR NON-REACTIVE 06/15/2023   LABRPR NON-REACTIVE 12/21/2022   LABRPR NON-REACTIVE 10/20/2022    STI Results GC GC CT CT  Latest Ref Rng & Units  NEGATIVE  NEGATIVE  06/12/2024  8:57 AM Negative    Negative    Negative   Negative    Negative    Negative    10/20/2023 10:47 AM Negative    Negative    Negative   Negative    Negative    Negative    08/17/2023 10:15 AM Negative    Negative    Negative   Negative    Negative    Negative    02/16/2023 10:31 AM Negative    Negative   Negative    Negative    10/20/2022 11:18 AM Negative   Negative    10/20/2022 11:17 AM Positive    Negative   Negative    Negative    07/02/2022  2:11  PM Negative   Negative    08/23/2018 12:00 AM Negative   Negative    04/07/2017 12:00 AM Negative   Negative    05/25/2016 12:00 AM Negative   Negative    04/09/2014 12:00 AM NG: Negative   CT: Negative    07/14/2013  1:16 AM  POSITIVE   POSITIVE     Hepatitis B Lab Results  Component Value Date   HEPBSAB REACTIVE (A) 07/02/2022   HEPBSAG NON-REACTIVE 07/02/2022   HEPBCAB NON-REACTIVE 07/02/2022   Hepatitis C Lab Results  Component Value Date   HEPCAB NON-REACTIVE 07/02/2022   Hepatitis A Lab Results  Component Value Date   HAV REACTIVE (A) 07/02/2022   Lipids: Lab Results  Component Value Date   CHOL 210 (H) 06/15/2023   TRIG 82 06/15/2023   HDL 56 06/15/2023   CHOLHDL 3.8 06/15/2023   VLDL 12 04/07/2017   LDLCALC 136 (H) 06/15/2023    TARGET DATE: The 7th  Assessment: DJ presents today for his maintenance Cabenuva  injections. Past injections were tolerated well without issues. Last HIV RNA was not detected in July. Doing well with no issues today.  Administered cabotegravir  600mg /63mL in left upper outer quadrant of the  gluteal muscle. Administered rilpivirine  900 mg/3mL in the right upper outer quadrant of the gluteal muscle. No issues with injections. He will follow up in 2 months for next set of injections.  Plan: - Cabenuva  injections administered - Next injections scheduled for 10/16/24 with me - Call with any issues or questions  Alexie Lanni L. Brandyn Lowrey, PharmD, BCIDP, AAHIVP, CPP Clinical Pharmacist Practitioner - Infectious Diseases Clinical Pharmacist Lead - Specialty Pharmacy [facility] for Infectious Disease

## 2024-08-08 ENCOUNTER — Telehealth: Payer: Self-pay

## 2024-08-08 ENCOUNTER — Encounter: Payer: Self-pay | Admitting: Pharmacist

## 2024-08-08 NOTE — Telephone Encounter (Signed)
 [facility] Patient Advocate Encounter  Patient's medications CABENUVA  have been couriered to [facility] from [facility] [employer] and will be administered at the patients appointment on 08/14/24.  Charmaine Sharps, CPhT Specialty Pharmacy Patient [facility] for Infectious Disease Phone: [phone] Fax:  [phone]

## 2024-08-14 ENCOUNTER — Other Ambulatory Visit: Payer: Self-pay

## 2024-08-14 ENCOUNTER — Ambulatory Visit: Payer: Self-pay | Admitting: Pharmacist

## 2024-08-14 DIAGNOSIS — B20 Human immunodeficiency virus [HIV] disease: Secondary | ICD-10-CM

## 2024-08-14 MED ORDER — CABOTEGRAVIR & RILPIVIRINE ER 600 & 900 MG/3ML IM SUER
1.0000 | Freq: Once | INTRAMUSCULAR | Status: AC
  Administered 2024-08-14: 1 via INTRAMUSCULAR

## 2024-09-26 ENCOUNTER — Telehealth: Payer: Self-pay

## 2024-09-26 NOTE — Telephone Encounter (Signed)
 Received a call from [place] with the [facility] Department requesting confirmation that the patient is currently in care and treatment for positive RPR. Confirmed that the patient is in care and received Bicillin  (BICx3) weekly injections, with treatment initiated in July 2027.  Enis Kleine, LPN

## 2024-10-10 ENCOUNTER — Telehealth: Payer: Self-pay

## 2024-10-10 NOTE — Telephone Encounter (Signed)
 [facility] Patient Advocate Encounter  Patient's medications CABENUVA  have been couriered to [facility] from [facility] and will be administered at the patients appointment on 10/16/24.  Charmaine Sharps, CPhT Specialty Pharmacy Patient [facility] for Infectious Disease Phone: [phone] Fax:  [phone]

## 2024-10-15 NOTE — Progress Notes (Unsigned)
 HPI: Nathan Maynard is a 33 y.o. male who presents to the [facility] pharmacy clinic for Cabenuva  administration.  Referring ID Provider: Dr. Overton  Patient Active Problem List   Diagnosis Date Noted   Tobacco abuse 04/28/2017   Encounter for long-term (current) use of high-risk medication 10/28/2016   Screening examination for venereal disease 10/28/2016   Tooth gap 12/04/2013   Depression 11/28/2012   Human immunodeficiency virus I infection (HCC) 04/07/2009   Eczema 04/07/2009    Patient's Medications  New Prescriptions   No medications on file  Previous Medications   CABOTEGRAVIR  & RILPIVIRINE  ER (CABENUVA ) 600 & 900 MG/3ML INJECTION    Inject 1 kit into the muscle every 2 (two) months.   CETIRIZINE (ZYRTEC) 10 MG TABLET    Take 10 mg by mouth daily.  Modified Medications   No medications on file  Discontinued Medications   No medications on file    Allergies: Allergies  Allergen Reactions   Latex Itching    Labs: Lab Results  Component Value Date   HIV1RNAQUANT NOT DETECTED 06/12/2024   HIV1RNAQUANT Not Detected 12/15/2023   HIV1RNAQUANT Not Detected 06/15/2023   CD4TABS 772 06/12/2024   CD4TABS 420 02/21/2019   CD4TABS 430 08/23/2018    RPR and STI Lab Results  Component Value Date   LABRPR NON-REACTIVE 12/15/2023   LABRPR NON-REACTIVE 10/20/2023   LABRPR NON-REACTIVE 06/15/2023   LABRPR NON-REACTIVE 12/21/2022   LABRPR NON-REACTIVE 10/20/2022    STI Results GC GC CT CT  Latest Ref Rng & Units  NEGATIVE  NEGATIVE  06/12/2024  8:57 AM Negative    Negative    Negative   Negative    Negative    Negative    10/20/2023 10:47 AM Negative    Negative    Negative   Negative    Negative    Negative    08/17/2023 10:15 AM Negative    Negative    Negative   Negative    Negative    Negative    02/16/2023 10:31 AM Negative    Negative   Negative    Negative    10/20/2022 11:18 AM Negative   Negative    10/20/2022 11:17 AM Positive    Negative   Negative     Negative    07/02/2022  2:11 PM Negative   Negative    08/23/2018 12:00 AM Negative   Negative    04/07/2017 12:00 AM Negative   Negative    05/25/2016 12:00 AM Negative   Negative    04/09/2014 12:00 AM NG: Negative   CT: Negative    07/14/2013  1:16 AM  POSITIVE   POSITIVE     Hepatitis B Lab Results  Component Value Date   HEPBSAB REACTIVE (A) 07/02/2022   HEPBSAG NON-REACTIVE 07/02/2022   HEPBCAB NON-REACTIVE 07/02/2022   Hepatitis C Lab Results  Component Value Date   HEPCAB NON-REACTIVE 07/02/2022   Hepatitis A Lab Results  Component Value Date   HAV REACTIVE (A) 07/02/2022   Lipids: Lab Results  Component Value Date   CHOL 210 (H) 06/15/2023   TRIG 82 06/15/2023   HDL 56 06/15/2023   CHOLHDL 3.8 06/15/2023   VLDL 12 04/07/2017   LDLCALC 136 (H) 06/15/2023    Target Date: 7th of the month   Assessment: Dj presents today for his maintenance Cabenuva  injections. Past injections were tolerated well without issues. Last HIV RNA was negative on 06/12/2024. Doing well with no issues today.  Lab work:  Urine/oral/rectal cytologies for GC/Chlamydia, RPR   Eligible vaccinations: Influenza, Shingrix 1/2, Covid - Patient agreeable to all vaccines today  Cabenuva : Administered cabotegravir  600mg /32mL in left upper outer quadrant of the gluteal muscle. Administered rilpivirine  900 mg/3mL in the right upper outer quadrant of the gluteal muscle. No issues with injections. DJ will follow up in 2 months for next set of injections.  Plan: - Cabenuva  injections administered - Next injections scheduled for 12/20/2024 with Dr. Overton, then 02/12/2025 with Cassie - Influenza, Covid, and Shingrix 1/2 vaccines administered (due for second dose at next visit) - Urine/oral/rectal cytologies for GC/Chlamydia, RPR today - Call with any issues or questions  Feliciano Close, PharmD PGY2 Infectious Diseases Pharmacy Resident

## 2024-10-16 ENCOUNTER — Other Ambulatory Visit: Payer: Self-pay

## 2024-10-16 ENCOUNTER — Ambulatory Visit (INDEPENDENT_AMBULATORY_CARE_PROVIDER_SITE_OTHER): Payer: Self-pay | Admitting: Pharmacist

## 2024-10-16 DIAGNOSIS — B20 Human immunodeficiency virus [HIV] disease: Secondary | ICD-10-CM

## 2024-10-16 DIAGNOSIS — Z23 Encounter for immunization: Secondary | ICD-10-CM

## 2024-10-16 DIAGNOSIS — Z113 Encounter for screening for infections with a predominantly sexual mode of transmission: Secondary | ICD-10-CM

## 2024-10-16 MED ORDER — CABOTEGRAVIR & RILPIVIRINE ER 600 & 900 MG/3ML IM SUER
1.0000 | Freq: Once | INTRAMUSCULAR | Status: AC
  Administered 2024-10-16: 1 via INTRAMUSCULAR

## 2024-10-17 LAB — URINE CYTOLOGY ANCILLARY ONLY
Chlamydia: NEGATIVE
Comment: NEGATIVE
Comment: NORMAL
Neisseria Gonorrhea: NEGATIVE

## 2024-10-17 LAB — CYTOLOGY, (ORAL, ANAL, URETHRAL) ANCILLARY ONLY
Chlamydia: NEGATIVE
Chlamydia: NEGATIVE
Comment: NEGATIVE
Comment: NEGATIVE
Comment: NORMAL
Comment: NORMAL
Neisseria Gonorrhea: NEGATIVE
Neisseria Gonorrhea: NEGATIVE

## 2024-10-17 LAB — RPR: RPR Ser Ql: NONREACTIVE

## 2024-11-27 ENCOUNTER — Telehealth: Payer: Self-pay

## 2024-11-27 NOTE — Telephone Encounter (Signed)
 [facility] Patient Advocate Encounter  Patient's medications CABENUVA  have been couriered to [facility] from [facility] [employer] and will be administered at the patients appointment on 12/20/24.  Charmaine Sharps, CPhT Specialty Pharmacy Patient [facility] for Infectious Disease Phone: [phone] Fax:  [phone]

## 2024-12-03 ENCOUNTER — Other Ambulatory Visit (HOSPITAL_COMMUNITY): Payer: Self-pay

## 2024-12-03 ENCOUNTER — Telehealth: Payer: Self-pay

## 2024-12-03 NOTE — Telephone Encounter (Signed)
 ERROR

## 2024-12-04 DIAGNOSIS — A539 Syphilis, unspecified: Secondary | ICD-10-CM

## 2024-12-18 ENCOUNTER — Ambulatory Visit: Payer: Self-pay

## 2024-12-20 ENCOUNTER — Ambulatory Visit: Payer: Self-pay | Admitting: Internal Medicine

## 2024-12-20 ENCOUNTER — Ambulatory Visit: Payer: Self-pay | Admitting: Pharmacist

## 2024-12-26 ENCOUNTER — Ambulatory Visit: Payer: Self-pay | Admitting: Pharmacist

## 2024-12-26 ENCOUNTER — Other Ambulatory Visit: Payer: Self-pay

## 2024-12-26 DIAGNOSIS — Z23 Encounter for immunization: Secondary | ICD-10-CM

## 2024-12-26 DIAGNOSIS — B20 Human immunodeficiency virus [HIV] disease: Secondary | ICD-10-CM

## 2024-12-26 DIAGNOSIS — Z113 Encounter for screening for infections with a predominantly sexual mode of transmission: Secondary | ICD-10-CM

## 2024-12-26 MED ORDER — CABOTEGRAVIR & RILPIVIRINE ER 600 & 900 MG/3ML IM SUER
1.0000 | Freq: Once | INTRAMUSCULAR | Status: AC
  Administered 2024-12-26: 1 via INTRAMUSCULAR

## 2024-12-26 NOTE — Progress Notes (Unsigned)
 "  HPI: Nathan Maynard is a 34 y.o. male who presents to the [facility] pharmacy clinic for Cabenuva  administration.  Referring ID Provider: Dr. Overton  Patient Active Problem List   Diagnosis Date Noted   Tobacco abuse 04/28/2017   Encounter for long-term (current) use of high-risk medication 10/28/2016   Screening examination for venereal disease 10/28/2016   Tooth gap 12/04/2013   Depression 11/28/2012   Human immunodeficiency virus I infection (HCC) 04/07/2009   Eczema 04/07/2009    Patient's Medications  New Prescriptions   No medications on file  Previous Medications   CABOTEGRAVIR  & RILPIVIRINE  ER (CABENUVA ) 600 & 900 MG/3ML INJECTION    Inject 1 kit into the muscle every 2 (two) months.   CETIRIZINE (ZYRTEC) 10 MG TABLET    Take 10 mg by mouth daily.  Modified Medications   No medications on file  Discontinued Medications   No medications on file    Allergies: Allergies[1]  Labs: Lab Results  Component Value Date   HIV1RNAQUANT NOT DETECTED 06/12/2024   HIV1RNAQUANT Not Detected 12/15/2023   HIV1RNAQUANT Not Detected 06/15/2023   CD4TABS 772 06/12/2024   CD4TABS 420 02/21/2019   CD4TABS 430 08/23/2018    RPR and STI Lab Results  Component Value Date   LABRPR NON-REACTIVE 10/16/2024   LABRPR NON-REACTIVE 12/15/2023   LABRPR NON-REACTIVE 10/20/2023   LABRPR NON-REACTIVE 06/15/2023   LABRPR NON-REACTIVE 12/21/2022    STI Results GC GC CT CT  Latest Ref Rng & Units  NEGATIVE  NEGATIVE  10/16/2024 10:02 AM Negative    Negative    Negative   Negative    Negative    Negative    06/12/2024  8:57 AM Negative    Negative    Negative   Negative    Negative    Negative    10/20/2023 10:47 AM Negative    Negative    Negative   Negative    Negative    Negative    08/17/2023 10:15 AM Negative    Negative    Negative   Negative    Negative    Negative    02/16/2023 10:31 AM Negative    Negative   Negative    Negative    10/20/2022 11:18 AM Negative   Negative     10/20/2022 11:17 AM Positive    Negative   Negative    Negative    07/02/2022  2:11 PM Negative   Negative    08/23/2018 12:00 AM Negative   Negative    04/07/2017 12:00 AM Negative   Negative    05/25/2016 12:00 AM Negative   Negative    04/09/2014 12:00 AM NG: Negative   CT: Negative    07/14/2013  1:16 AM  POSITIVE   POSITIVE     Hepatitis B Lab Results  Component Value Date   HEPBSAB REACTIVE (A) 07/02/2022   HEPBSAG NON-REACTIVE 07/02/2022   HEPBCAB NON-REACTIVE 07/02/2022   Hepatitis C Lab Results  Component Value Date   HEPCAB NON-REACTIVE 07/02/2022   Hepatitis A Lab Results  Component Value Date   HAV REACTIVE (A) 07/02/2022   Lipids: Lab Results  Component Value Date   CHOL 210 (H) 06/15/2023   TRIG 82 06/15/2023   HDL 56 06/15/2023   CHOLHDL 3.8 06/15/2023   VLDL 12 04/07/2017   LDLCALC 136 (H) 06/15/2023    Target Date: 7th  Assessment: Nathan Maynard presents today for his maintenance Cabenuva  injections. Past injections were tolerated well without issues. Last HIV  RNA was undetectable on 06/12/24. Doing well with no issues today.  Lab work:  HIV RNA today; offered STI testing, Nathan Maynard accepts urine, oral, and rectal STI testing and RPR testing.   Eligible vaccinations:  Offered shingles vaccine today, Nathan Maynard accepted and the shingles vaccine was administered in the right deltoid muscle.   Cabenuva : Administered cabotegravir  600mg /69mL in left upper outer quadrant of the gluteal muscle. Administered rilpivirine  900 mg/3mL in the right upper outer quadrant of the gluteal muscle. No issues with injections. Nathan Maynard will follow up in 2 months for next set of injections.  Plan: - Cabenuva  injections administered - Shingles vaccine administered - HIV RNA today, STI testing today - Next injections scheduled for 02/12/25 with Dr. Overton - Call with any issues or questions  Maurilio Patten, PharmD PGY1 Pharmacy Resident [facility] 12/26/2024 2:51 PM     [1]   Allergies Allergen Reactions   Latex Itching   "

## 2024-12-27 LAB — URINE CYTOLOGY ANCILLARY ONLY
Chlamydia: NEGATIVE
Comment: NEGATIVE
Comment: NORMAL
Neisseria Gonorrhea: NEGATIVE

## 2024-12-27 LAB — CYTOLOGY, (ORAL, ANAL, URETHRAL) ANCILLARY ONLY
Chlamydia: NEGATIVE
Chlamydia: NEGATIVE
Comment: NEGATIVE
Comment: NEGATIVE
Comment: NORMAL
Comment: NORMAL
Neisseria Gonorrhea: NEGATIVE
Neisseria Gonorrhea: NEGATIVE

## 2024-12-28 NOTE — Progress Notes (Signed)
 SABRA

## 2024-12-29 LAB — SYPHILIS: RPR W/REFLEX TO RPR TITER AND TREPONEMAL ANTIBODIES, TRADITIONAL SCREENING AND DIAGNOSIS ALGORITHM: RPR Ser Ql: REACTIVE — AB

## 2024-12-29 LAB — HIV-1 RNA QUANT-NO REFLEX-BLD
HIV 1 RNA Quant: NOT DETECTED {copies}/mL
HIV-1 RNA Quant, Log: NOT DETECTED {Log_copies}/mL

## 2024-12-29 LAB — T PALLIDUM AB: T Pallidum Abs: POSITIVE — AB

## 2024-12-29 LAB — RPR TITER: RPR Titer: 1:1 {titer} — ABNORMAL HIGH

## 2025-01-01 ENCOUNTER — Telehealth: Payer: Self-pay

## 2025-01-01 MED ORDER — DOXYCYCLINE HYCLATE 100 MG PO TABS: 100.0000 mg | ORAL_TABLET | Freq: Two times a day (BID) | ORAL | 0 refills | Status: AC

## 2025-01-01 NOTE — Telephone Encounter (Signed)
 Maurilio just spoke with him! See her response below.

## 2025-01-01 NOTE — Progress Notes (Unsigned)
 Spoke with Nathan Maynard about syphilis testing results. He said he had seen the result and was worried that the RPR titer of 1:1 was a very high positive result. 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 I asked him about symptoms of syphilis such as rash or mucocutaneous/genital lesions. He said he did notice bumps recently in his groin and near his anus/rectum. I told him these are potential symptoms of syphilis. He asked if he could come get the shot for syphilis treatment. I explained that unfortunately, the penicillin  shot is on national shortage so this is not an available option for him. The alternative is doxycycline , 1 tablet twice daily x 14 days. I counseled him on dosing and side effects, and to abstain from sex until 7 days after completion of treatment. He expresses understanding. The medication has been sent to his preferred pharmacy, [facility] off of [address].   Nathan Maynard, PharmD PGY1 Pharmacy Resident [facility] 01/01/2025 10:35 AM

## 2025-01-01 NOTE — Telephone Encounter (Signed)
 Patient called, states he is on the way to the clinic to receive STI treatment since he saw a positive result in MyChart.   Advised him provider has not had a chance to review all of his labs, but that he does not need to come to the clinic for treatment. RPR newly reactive. Discussed that provider would need to review and that medication would be oral antibiotic called into pharmacy if appropriate. Patient verbalized understanding and has no further questions.   Taren Dymek, BSN, RN

## 2025-01-02 NOTE — Telephone Encounter (Signed)
 Spoke with Nathan Maynard about syphilis testing results. He said he had seen the result and was worried that the RPR titer of 1:1 was a very high positive result. 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 I asked him about symptoms of syphilis such as rash or mucocutaneous/genital lesions. He said he did notice bumps recently in his groin and near his anus/rectum. I told him these are potential symptoms of syphilis. He asked if he could come get the shot for syphilis treatment. I explained that unfortunately, the penicillin  shot is on national shortage so this is not an available option for him. The alternative is doxycycline , 1 tablet twice daily x 14 days. I counseled him on dosing and side effects, and to abstain from sex until 7 days after completion of treatment. He expresses understanding. The medication has been sent to his preferred pharmacy, [facility] off of [address].   Nathan Maynard, PharmD PGY1 Pharmacy Resident [facility] 01/01/2025 10:35 AM

## 2025-02-12 ENCOUNTER — Ambulatory Visit: Payer: Self-pay | Admitting: Internal Medicine

## 2025-02-12 ENCOUNTER — Ambulatory Visit: Payer: Self-pay | Admitting: Pharmacist

## 2025-02-12 ENCOUNTER — Encounter: Payer: Self-pay | Admitting: Internal Medicine

## 2025-04-16 ENCOUNTER — Ambulatory Visit: Payer: Self-pay | Admitting: Pharmacist
